# Patient Record
Sex: Female | Born: 1995 | Race: White | Hispanic: No | Marital: Single | Smoking: Current every day smoker
Health system: Southern US, Community
[De-identification: ages and names within clinical notes are randomized; demographics above are authoritative.]

## PROBLEM LIST (undated history)

## (undated) DIAGNOSIS — K279 Peptic ulcer, site unspecified, unspecified as acute or chronic, without hemorrhage or perforation: Secondary | ICD-10-CM

## (undated) DIAGNOSIS — G43909 Migraine, unspecified, not intractable, without status migrainosus: Secondary | ICD-10-CM

## (undated) DIAGNOSIS — R1115 Cyclical vomiting syndrome unrelated to migraine: Secondary | ICD-10-CM

## (undated) HISTORY — PX: ANTERIOR CRUCIATE LIGAMENT REPAIR: SHX115

## (undated) HISTORY — PX: KNEE SURGERY: SHX244

## (undated) HISTORY — DX: Migraine, unspecified, not intractable, without status migrainosus: G43.909

## (undated) HISTORY — DX: Peptic ulcer, site unspecified, unspecified as acute or chronic, without hemorrhage or perforation: K27.9

---
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
## 2015-05-23 ENCOUNTER — Emergency Department: Payer: BC Managed Care – PPO

## 2015-05-23 ENCOUNTER — Encounter: Payer: Self-pay | Admitting: *Deleted

## 2015-05-23 ENCOUNTER — Emergency Department
Admission: EM | Admit: 2015-05-23 | Discharge: 2015-05-24 | Disposition: A | Discharge status: Home / Self Care | Payer: BC Managed Care – PPO | Attending: Student | Admitting: Student

## 2015-05-23 DIAGNOSIS — R112 Nausea with vomiting, unspecified: Secondary | ICD-10-CM

## 2015-05-23 DIAGNOSIS — Z720 Tobacco use: Secondary | ICD-10-CM | POA: Insufficient documentation

## 2015-05-23 DIAGNOSIS — Z3202 Encounter for pregnancy test, result negative: Secondary | ICD-10-CM | POA: Insufficient documentation

## 2015-05-23 DIAGNOSIS — G43909 Migraine, unspecified, not intractable, without status migrainosus: Secondary | ICD-10-CM | POA: Insufficient documentation

## 2015-05-23 DIAGNOSIS — R1011 Right upper quadrant pain: Secondary | ICD-10-CM

## 2015-05-23 DIAGNOSIS — K8050 Calculus of bile duct without cholangitis or cholecystitis without obstruction: Secondary | ICD-10-CM

## 2015-05-23 HISTORY — DX: Migraine, unspecified, not intractable, without status migrainosus: G43.909

## 2015-05-23 LAB — URINALYSIS COMPLETE WITH MICROSCOPIC (ARMC ONLY)
BILIRUBIN URINE: NEGATIVE
GLUCOSE, UA: NEGATIVE mg/dL
HGB URINE DIPSTICK: NEGATIVE
LEUKOCYTES UA: NEGATIVE
Nitrite: NEGATIVE
Protein, ur: 30 mg/dL — AB
SPECIFIC GRAVITY, URINE: 1.019 (ref 1.005–1.030)
pH: 7 (ref 5.0–8.0)

## 2015-05-23 LAB — COMPREHENSIVE METABOLIC PANEL
ALBUMIN: 4.6 g/dL (ref 3.5–5.0)
ALK PHOS: 78 U/L (ref 38–126)
ALT: 21 U/L (ref 14–54)
ALT: 17 U/L (ref 14–54)
ANION GAP: 13 (ref 5–15)
AST: 35 U/L (ref 15–41)
AST: 20 U/L (ref 15–41)
Albumin: 4.4 g/dL (ref 3.5–5.0)
Alkaline Phosphatase: 73 U/L (ref 38–126)
Anion gap: 11 (ref 5–15)
BUN: 21 mg/dL — AB (ref 6–20)
BUN: 21 mg/dL — AB (ref 6–20)
CALCIUM: 9.7 mg/dL (ref 8.9–10.3)
CALCIUM: 9.5 mg/dL (ref 8.9–10.3)
CHLORIDE: 106 mmol/L (ref 101–111)
CO2: 22 mmol/L (ref 22–32)
CO2: 24 mmol/L (ref 22–32)
Chloride: 105 mmol/L (ref 101–111)
Creatinine, Ser: 0.86 mg/dL (ref 0.44–1.00)
Creatinine, Ser: 0.82 mg/dL (ref 0.44–1.00)
GFR calc Af Amer: >60 mL/min (ref >60)
GFR calc non Af Amer: >60 mL/min (ref >60)
Glucose, Bld: 104 mg/dL — ABNORMAL HIGH (ref 65–99)
Glucose, Bld: 105 mg/dL — ABNORMAL HIGH (ref 65–99)
POTASSIUM: 5.3 mmol/L — AB (ref 3.5–5.1)
POTASSIUM: 3.5 mmol/L (ref 3.5–5.1)
Sodium: 140 mmol/L (ref 135–145)
Sodium: 141 mmol/L (ref 135–145)
TOTAL PROTEIN: 8.3 g/dL — AB (ref 6.5–8.1)
TOTAL PROTEIN: 7.8 g/dL (ref 6.5–8.1)
Total Bilirubin: 2 mg/dL — ABNORMAL HIGH (ref 0.3–1.2)
Total Bilirubin: 1.2 mg/dL (ref 0.3–1.2)

## 2015-05-23 LAB — CBC WITH DIFFERENTIAL/PLATELET
BASOS ABS: 0 10*3/uL (ref 0–0.1)
Basophils Relative: 0 %
Eosinophils Absolute: 0 10*3/uL (ref 0–0.7)
Eosinophils Relative: 0 %
HEMATOCRIT: 46 % (ref 35.0–47.0)
HEMOGLOBIN: 15.3 g/dL (ref 12.0–16.0)
LYMPHS ABS: 1.6 10*3/uL (ref 1.0–3.6)
LYMPHS PCT: 13 %
MCH: 31 pg (ref 26.0–34.0)
MCHC: 33.2 g/dL (ref 32.0–36.0)
MCV: 93.5 fL (ref 80.0–100.0)
MONO ABS: 0.8 10*3/uL (ref 0.2–0.9)
MONOS PCT: 6 %
NEUTROS ABS: 10.3 10*3/uL — AB (ref 1.4–6.5)
Neutrophils Relative %: 81 %
Platelets: 382 10*3/uL (ref 150–440)
RBC: 4.92 MIL/uL (ref 3.80–5.20)
RDW: 12.8 % (ref 11.5–14.5)
WBC: 12.7 10*3/uL — AB (ref 3.6–11.0)

## 2015-05-23 LAB — PREGNANCY, URINE: PREG TEST UR: NEGATIVE

## 2015-05-23 MED ORDER — ONDANSETRON 4 MG PO TBDP
4.0000 mg | ORAL_TABLET | Freq: Once | ORAL | Status: AC
  Administered 2015-05-23: 4 mg via ORAL

## 2015-05-23 MED ORDER — PROMETHAZINE HCL 25 MG/ML IJ SOLN
INTRAMUSCULAR | Status: AC
  Administered 2015-05-23: 25 mg via INTRAMUSCULAR
  Filled 2015-05-23: qty 1

## 2015-05-23 MED ORDER — PROMETHAZINE HCL 25 MG/ML IJ SOLN
25.0000 mg | Freq: Once | INTRAMUSCULAR | Status: AC
  Administered 2015-05-23: 25 mg via INTRAMUSCULAR

## 2015-05-23 MED ORDER — ONDANSETRON 4 MG PO TBDP
ORAL_TABLET | ORAL | Status: AC
  Administered 2015-05-23: 4 mg via ORAL
  Filled 2015-05-23: qty 1

## 2015-05-23 MED ORDER — SODIUM CHLORIDE 0.9 % IV BOLUS (SEPSIS)
1000.0000 mL | Freq: Once | INTRAVENOUS | Status: AC
  Administered 2015-05-23: 1000 mL via INTRAVENOUS

## 2015-05-23 NOTE — ED Notes (Signed)
Developed ha few days ago went to urgent care yesterday had toradol injection relieved headache, ha gone still has n/v

## 2015-05-23 NOTE — ED Provider Notes (Signed)
CSN: [id number]     Arrival date & time 05/23/15  1742 History   First MD Initiated Contact with Patient 05/23/15 1829     Chief Complaint  Patient presents with  . Emesis     (Consider location/radiation/quality/duration/timing/severity/associated sxs/prior Treatment) HPI  19 year old female presents today for evaluation of nausea and vomiting. Nausea and vomiting began 2 days ago when she developed a migraine headache. This was her normal migraine headache that did not respond to sumatriptan. She went to the urgent care facility where she was given a shot of Toradol. Her completely relieved her headache but she is continued to have nausea and vomiting. Not tried any medications for nausea or vomiting. She is unable to keep anything down. She states she has vomited nearly every hour for the last 2 days. Contents of vomit are watery with no blood or bile. Nausea and vomiting is usually worse after eating. She has a strong family history of cholecystectomy. She denies any abdominal pain fevers neck pain urinary symptoms. She has not had a bowel movement in 3 days.   Past Medical History  Diagnosis Date  . Migraine    Past Surgical History  Procedure Laterality Date  . Knee surgery     No family history on file. History  Substance Use Topics  . Smoking status: Current Some Day Smoker  . Smokeless tobacco: Not on file  . Alcohol Use: Yes   OB History    No data available     Review of Systems  Constitutional: Negative for fever, chills, activity change and fatigue.  HENT: Negative for congestion, sinus pressure and sore throat.   Eyes: Negative for visual disturbance.  Respiratory: Negative for cough, chest tightness and shortness of breath.   Cardiovascular: Negative for chest pain and leg swelling.  Gastrointestinal: Positive for nausea and vomiting. Negative for abdominal pain, diarrhea and constipation (no bowel movement in 3 days).  Genitourinary: Negative for dysuria, flank  pain and pelvic pain.  Musculoskeletal: Negative for arthralgias and gait problem.  Skin: Negative for rash.  Neurological: Negative for weakness, numbness and headaches.  Hematological: Negative for adenopathy.  Psychiatric/Behavioral: Negative for behavioral problems, confusion and agitation.      Allergies  Review of patient's allergies indicates no known allergies.  Home Medications   Prior to Admission medications   Not on File   BP 144/90 mmHg  Pulse 56  Temp(Src) 97.9 F (36.6 C) (Oral)  Resp 18  Ht  (1.727 m)  Wt 203 lb (92.08 kg)  BMI 30.87 kg/m2  SpO2 99%  LMP 05/14/2015 (Exact Date) Physical Exam  Constitutional: She is oriented to person, place, and time. She appears well-developed and well-nourished. No distress.  HENT:  Head: Normocephalic and atraumatic.  Mouth/Throat: Oropharynx is clear and moist.  Eyes: EOM are normal. Pupils are equal, round, and reactive to light. Right eye exhibits no discharge. Left eye exhibits no discharge.  Neck: Normal range of motion. Neck supple.  Cardiovascular: Normal rate, regular rhythm and intact distal pulses.   Pulmonary/Chest: Effort normal and breath sounds normal. No respiratory distress. She exhibits no tenderness.  Abdominal: Soft. She exhibits no distension and no mass. There is tenderness (minimal right upper quadrant tenderness to palpation without guarding or rebound. No distention noted. No McBurney's point tenderness.). There is no rebound and no guarding.  Musculoskeletal: Normal range of motion. She exhibits no edema.  Neurological: She is alert and oriented to person, place, and time. She has normal reflexes.  Skin: Skin is warm and dry.  Psychiatric: She has a normal mood and affect. Her behavior is normal. Thought content normal.    ED Course  Procedures (including critical care time) Labs Review Labs Reviewed  CBC WITH DIFFERENTIAL/PLATELET - Abnormal; Notable for the following:    WBC 12.7 (*)     Neutro Abs 10.3 (*)    All other components within normal limits  COMPREHENSIVE METABOLIC PANEL - Abnormal; Notable for the following:    Potassium 5.3 (*)    Glucose, Bld 104 (*)    BUN 21 (*)    Total Protein 8.3 (*)    Total Bilirubin 2.0 (*)    All other components within normal limits  URINALYSIS COMPLETEWITH MICROSCOPIC ([facility] ONLY) - Abnormal; Notable for the following:    Color, Urine YELLOW (*)    APPearance CLEAR (*)    Ketones, ur 2+ (*)    Protein, ur 30 (*)    Bacteria, UA RARE (*)    Squamous Epithelial / LPF 0-5 (*)    All other components within normal limits  COMPREHENSIVE METABOLIC PANEL - Abnormal; Notable for the following:    Glucose, Bld 105 (*)    BUN 21 (*)    All other components within normal limits  PREGNANCY, URINE    Imaging Review Dg Abd 1 View  05/23/2015   CLINICAL DATA:  Nausea/vomiting, no bowel movement since Wednesday  EXAM: ABDOMEN - 1 VIEW  COMPARISON:  None.  FINDINGS: Nonobstructive bowel gas pattern.  Normal colonic stool burden.  Visualized osseous structures are within normal limits.  IMPRESSION: Unremarkable abdominal radiograph.   Electronically Signed   By: Charline Bills M.D.   On: 05/23/2015 20:38   US Abdomen Limited Ruq  05/23/2015   CLINICAL DATA:  RIGHT upper quadrant pain for 1 day, vomiting for 4 days.  EXAM: US ABDOMEN LIMITED - RIGHT UPPER QUADRANT  COMPARISON:  None.  FINDINGS: Gallbladder:  Mild gallbladder distension, with internal echoes consistent with sludge. No discrete gallstone, no wall thickening or pericholecystic fluid. No sonographic Murphy's sign elicited.  Common bile duct:  Diameter: 2 mm  Liver:  No focal lesion identified. Within normal limits in parenchymal echogenicity. Hepatopetal portal vein.  IMPRESSION: Gallbladder distended with sludge without sonographic findings of acute cholecystitis.   Electronically Signed   By: Awilda Metro M.D.   On: 05/23/2015 23:09     EKG Interpretation None       MDM   Final diagnoses:  Right upper quadrant pain    19 year old female who presents to the ER with nausea and vomiting. On exam, patient found to have minimal right upper quadrant tenderness. Vital signs are stable. KUB was ordered and showed no obstruction. CBC showed slightly elevated white count at 12.7. CMP showed bilirubin at 1.2, upper limits of normal. Patient was given Zofran and had minimal relief of nausea. Ultrasound was ordered to examine the gallbladder. She was started on 1 L of fluids. She was then given 25 mg of Phenergan IV. After fluids and Phenergan patient was able tolerate by mouth. Ultrasound showed no slight distention with sludge, no signs of acute cholecystitis. After return from ultrasound, nausea began to return. She had one episode of vomiting. She was given 10 mg of Reglan IV which relieved her nausea. Patient was able tolerate by mouth well. Patient was reexamined and found to have no abdominal tenderness to palpation. Vital signs were stable at discharge. Patient agrees to follow-up with gastroenterology for evaluation,  possible HIDA scan. Patient will continue with clear fluids and nonfatty diet. She was given Phenergan and Reglan for antiemetic. She will return to the ER for any abdominal pain, vomiting, fevers.  Evon Slack, PA-C 05/24/15 0041  Evon Slack, PA-C 05/24/15 [id number]  Gayla Doss, MD 06/01/15 [phone]

## 2015-05-24 MED ORDER — METOCLOPRAMIDE HCL 10 MG PO TABS: 10.0000 mg | ORAL_TABLET | ORAL | Status: DC | PRN

## 2015-05-24 MED ORDER — METOCLOPRAMIDE HCL 5 MG/ML IJ SOLN
10.0000 mg | Freq: Once | INTRAMUSCULAR | Status: AC
  Administered 2015-05-24: 10 mg via INTRAVENOUS

## 2015-05-24 MED ORDER — METOCLOPRAMIDE HCL 5 MG/ML IJ SOLN
INTRAMUSCULAR | Status: AC
  Filled 2015-05-24: qty 2

## 2015-05-24 MED ORDER — PROMETHAZINE HCL 12.5 MG PO TABS: 12.5000 mg | ORAL_TABLET | ORAL | Status: DC | PRN

## 2015-05-24 MED ORDER — PROMETHAZINE HCL 25 MG/ML IJ SOLN
INTRAMUSCULAR | Status: AC
  Filled 2015-05-24: qty 1

## 2015-05-24 MED ORDER — PROMETHAZINE HCL 25 MG/ML IJ SOLN
25.0000 mg | Freq: Once | INTRAMUSCULAR | Status: AC
  Administered 2015-05-24: 25 mg via INTRAVENOUS

## 2015-05-24 NOTE — Discharge Instructions (Signed)
Biliary Colic  °Biliary colic is a steady or irregular pain in the upper abdomen. It is usually under the right side of the rib cage. It happens when gallstones interfere with the normal flow of bile from the gallbladder. Bile is a liquid that helps to digest fats. Bile is made in the liver and stored in the gallbladder. When you eat a meal, bile passes from the gallbladder through the cystic duct and the common bile duct into the small intestine. There, it mixes with partially digested food. If a gallstone blocks either of these ducts, the normal flow of bile is blocked. The muscle cells in the bile duct contract forcefully to try to move the stone. This causes the pain of biliary colic.  °SYMPTOMS  °· A person with biliary colic usually complains of pain in the upper abdomen. This pain can be: °¨ In the center of the upper abdomen just below the breastbone. °¨ In the upper-right part of the abdomen, near the gallbladder and liver. °¨ Spread back toward the right shoulder blade. °· Nausea and vomiting. °· The pain usually occurs after eating. °· Biliary colic is usually triggered by the digestive system's demand for bile. The demand for bile is high after fatty meals. Symptoms can also occur when a person who has been fasting suddenly eats a very large meal. Most episodes of biliary colic pass after 1 to 5 hours. After the most intense pain passes, your abdomen may continue to ache mildly for about 24 hours. °DIAGNOSIS  °After you describe your symptoms, your caregiver will perform a physical exam. He or she will pay attention to the upper right portion of your belly (abdomen). This is the area of your liver and gallbladder. An ultrasound will help your caregiver look for gallstones. Specialized scans of the gallbladder may also be done. Blood tests may be done, especially if you have fever or if your pain persists. °PREVENTION  °Biliary colic can be prevented by controlling the risk factors for gallstones. Some of  these risk factors, such as heredity, increasing age, and pregnancy are a normal part of life. Obesity and a high-fat diet are risk factors you can change through a healthy lifestyle. Women going through menopause who take hormone replacement therapy (estrogen) are also more likely to develop biliary colic. °TREATMENT  °· Pain medication may be prescribed. °· You may be encouraged to eat a fat-free diet. °· If the first episode of biliary colic is severe, or episodes of colic keep retuning, surgery to remove the gallbladder (cholecystectomy) is usually recommended. This procedure can be done through small incisions using an instrument called a laparoscope. The procedure often requires a brief stay in the hospital. Some people can leave the hospital the same day. It is the most widely used treatment in people troubled by painful gallstones. It is effective and safe, with no complications in more than 90% of cases. °· If surgery cannot be done, medication that dissolves gallstones may be used. This medication is expensive and can take months or years to work. Only small stones will dissolve. °· Rarely, medication to dissolve gallstones is combined with a procedure called shock-wave lithotripsy. This procedure uses carefully aimed shock waves to break up gallstones. In many people treated with this procedure, gallstones form again within a few years. °PROGNOSIS  °If gallstones block your cystic duct or common bile duct, you are at risk for repeated episodes of biliary colic. There is also a 25% chance that you will develop   a gallbladder infection(acute cholecystitis), or some other complication of gallstones within 10 to 20 years. If you have surgery, schedule it at a time that is convenient for you and at a time when you are not sick. °HOME CARE INSTRUCTIONS  °· Drink plenty of clear fluids. °· Avoid fatty, greasy or fried foods, or any foods that make your pain worse. °· Take medications as directed. °SEEK MEDICAL  CARE IF:  °· You develop a fever over 100.5° F (38.1° C). °· Your pain gets worse over time. °· You develop nausea that prevents you from eating and drinking. °· You develop vomiting. °SEEK IMMEDIATE MEDICAL CARE IF:  °· You have continuous or severe belly (abdominal) pain which is not relieved with medications. °· You develop nausea and vomiting which is not relieved with medications. °· You have symptoms of biliary colic and you suddenly develop a fever and shaking chills. This may signal cholecystitis. Call your caregiver immediately. °· You develop a yellow color to your skin or the white part of your eyes (jaundice). °Document Released: 04/18/2006 Document Revised: 02/07/2012 Document Reviewed: 06/27/2008 °Exit[employer]® Patient Information ©2015 [employer]. This information is not intended to replace advice given to you by your health care provider. Make sure you discuss any questions you have with your health care provider. ° °

## 2015-05-28 ENCOUNTER — Emergency Department
Admission: EM | Admit: 2015-05-28 | Discharge: 2015-05-28 | Disposition: A | Discharge status: Home / Self Care | Payer: BC Managed Care – PPO | Attending: Emergency Medicine | Admitting: Emergency Medicine

## 2015-05-28 ENCOUNTER — Emergency Department: Payer: BC Managed Care – PPO

## 2015-05-28 DIAGNOSIS — Z3202 Encounter for pregnancy test, result negative: Secondary | ICD-10-CM | POA: Insufficient documentation

## 2015-05-28 DIAGNOSIS — Z720 Tobacco use: Secondary | ICD-10-CM | POA: Diagnosis not present

## 2015-05-28 DIAGNOSIS — K529 Noninfective gastroenteritis and colitis, unspecified: Secondary | ICD-10-CM | POA: Diagnosis not present

## 2015-05-28 DIAGNOSIS — R197 Diarrhea, unspecified: Secondary | ICD-10-CM | POA: Diagnosis present

## 2015-05-28 LAB — URINALYSIS COMPLETE WITH MICROSCOPIC (ARMC ONLY)
BACTERIA UA: NONE SEEN
BILIRUBIN URINE: NEGATIVE
GLUCOSE, UA: NEGATIVE mg/dL
HGB URINE DIPSTICK: NEGATIVE
Leukocytes, UA: NEGATIVE
Nitrite: NEGATIVE
Protein, ur: NEGATIVE mg/dL
SPECIFIC GRAVITY, URINE: 1.013 (ref 1.005–1.030)
pH: 7 (ref 5.0–8.0)

## 2015-05-28 LAB — CBC WITH DIFFERENTIAL/PLATELET
Basophils Absolute: 0.1 10*3/uL (ref 0–0.1)
Basophils Relative: 0 %
EOS ABS: 0.1 10*3/uL (ref 0–0.7)
EOS PCT: 0 %
HCT: 49.9 % — ABNORMAL HIGH (ref 35.0–47.0)
Hemoglobin: 16.6 g/dL — ABNORMAL HIGH (ref 12.0–16.0)
LYMPHS ABS: 2.1 10*3/uL (ref 1.0–3.6)
Lymphocytes Relative: 12 %
MCH: 31 pg (ref 26.0–34.0)
MCHC: 33.3 g/dL (ref 32.0–36.0)
MCV: 93.2 fL (ref 80.0–100.0)
MONO ABS: 0.8 10*3/uL (ref 0.2–0.9)
MONOS PCT: 5 %
Neutro Abs: 15.1 10*3/uL — ABNORMAL HIGH (ref 1.4–6.5)
Neutrophils Relative %: 83 %
PLATELETS: 409 10*3/uL (ref 150–440)
RBC: 5.35 MIL/uL — ABNORMAL HIGH (ref 3.80–5.20)
RDW: 12.3 % (ref 11.5–14.5)
WBC: 18.2 10*3/uL — AB (ref 3.6–11.0)

## 2015-05-28 LAB — COMPREHENSIVE METABOLIC PANEL
ALT: 63 U/L — AB (ref 14–54)
AST: 27 U/L (ref 15–41)
Albumin: 4.9 g/dL (ref 3.5–5.0)
Alkaline Phosphatase: 85 U/L (ref 38–126)
Anion gap: 15 (ref 5–15)
BUN: 10 mg/dL (ref 6–20)
CALCIUM: 9.6 mg/dL (ref 8.9–10.3)
CO2: 24 mmol/L (ref 22–32)
CREATININE: 0.94 mg/dL (ref 0.44–1.00)
Chloride: 97 mmol/L — ABNORMAL LOW (ref 101–111)
GFR calc Af Amer: >60 mL/min (ref >60)
GFR calc non Af Amer: >60 mL/min (ref >60)
Glucose, Bld: 115 mg/dL — ABNORMAL HIGH (ref 65–99)
Potassium: 3.3 mmol/L — ABNORMAL LOW (ref 3.5–5.1)
SODIUM: 136 mmol/L (ref 135–145)
TOTAL PROTEIN: 8.8 g/dL — AB (ref 6.5–8.1)
Total Bilirubin: 2.7 mg/dL — ABNORMAL HIGH (ref 0.3–1.2)

## 2015-05-28 LAB — PREGNANCY, URINE: Preg Test, Ur: NEGATIVE

## 2015-05-28 MED ORDER — METRONIDAZOLE 500 MG PO TABS
500.0000 mg | ORAL_TABLET | Freq: Once | ORAL | Status: AC
  Administered 2015-05-28: 500 mg via ORAL

## 2015-05-28 MED ORDER — CIPROFLOXACIN HCL 500 MG PO TABS
ORAL_TABLET | ORAL | Status: AC
  Administered 2015-05-28: 500 mg via ORAL
  Filled 2015-05-28: qty 1

## 2015-05-28 MED ORDER — IOHEXOL 300 MG/ML  SOLN
100.0000 mL | Freq: Once | INTRAMUSCULAR | Status: AC | PRN
  Administered 2015-05-28: 100 mL via INTRAVENOUS

## 2015-05-28 MED ORDER — ONDANSETRON HCL 4 MG/2ML IJ SOLN
4.0000 mg | Freq: Once | INTRAMUSCULAR | Status: AC
  Administered 2015-05-28: 4 mg via INTRAVENOUS

## 2015-05-28 MED ORDER — CIPROFLOXACIN HCL 500 MG PO TABS: 500.0000 mg | ORAL_TABLET | Freq: Two times a day (BID) | ORAL | Status: AC

## 2015-05-28 MED ORDER — PROMETHAZINE HCL 25 MG/ML IJ SOLN
INTRAMUSCULAR | Status: AC
  Administered 2015-05-28: 25 mg via INTRAVENOUS
  Filled 2015-05-28: qty 1

## 2015-05-28 MED ORDER — CIPROFLOXACIN HCL 500 MG PO TABS
500.0000 mg | ORAL_TABLET | ORAL | Status: AC
  Administered 2015-05-28: 500 mg via ORAL

## 2015-05-28 MED ORDER — METRONIDAZOLE 500 MG PO TABS
ORAL_TABLET | ORAL | Status: AC
  Administered 2015-05-28: 500 mg via ORAL
  Filled 2015-05-28: qty 1

## 2015-05-28 MED ORDER — SODIUM CHLORIDE 0.9 % IV SOLN
1000.0000 mL | Freq: Once | INTRAVENOUS | Status: AC
  Administered 2015-05-28: 1000 mL via INTRAVENOUS

## 2015-05-28 MED ORDER — METRONIDAZOLE 500 MG PO TABS
500.0000 mg | ORAL_TABLET | Freq: Three times a day (TID) | ORAL | Status: AC
Start: 2015-05-28 — End: 2015-06-11

## 2015-05-28 MED ORDER — ONDANSETRON HCL 4 MG PO TABS: ORAL_TABLET | ORAL | Status: DC

## 2015-05-28 MED ORDER — IOHEXOL 240 MG/ML SOLN
25.0000 mL | Freq: Once | INTRAMUSCULAR | Status: AC | PRN
  Administered 2015-05-28: 25 mL via ORAL

## 2015-05-28 MED ORDER — PROMETHAZINE HCL 25 MG/ML IJ SOLN
12.5000 mg | Freq: Once | INTRAMUSCULAR | Status: AC
  Administered 2015-05-28: 25 mg via INTRAVENOUS

## 2015-05-28 NOTE — ED Notes (Signed)
AAox3.  Skin warm and dry.  No acute distress.  Ambulates with easy and steady gait.  NAD.

## 2015-05-28 NOTE — ED Provider Notes (Addendum)
[facility] Emergency Department Provider Note  ____________________________________________  Time seen: Approximately 7:26 PM  I have reviewed the triage vital signs and the nursing notes.   HISTORY  Chief Complaint Emesis and Diarrhea    HPI Renee Carter is a 19 y.o. female Patient has been having nausea and vomiting for over a week she has had Phenergan to take and this has not worked. Patient began having diarrhea today. Patient has not had a fever. Patient is having some epigastric pain that is not severe. Patient has lost 15 pounds in the last week. Patient is not bale to keep much down at all per herself and her parents. Patient's family went to [facility] approximately week ago father got sick with nausea and vomiting but has recovered. A SHEENT has remained ill. [place] is now closed I'm told because of the [place] Department detecting unsafe level of bacteria in the water.   Past Medical History  Diagnosis Date  . Migraine     Patient Active Problem List   Diagnosis Date Noted  . Migraine     Past Surgical History  Procedure Laterality Date  . Knee surgery      Current Outpatient Rx  Name  Route  Sig  Dispense  Refill  . metoCLOPramide (REGLAN) 10 MG tablet   Oral   Take 1 tablet (10 mg total) by mouth every 4 (four) hours as needed for nausea.   30 tablet   1   . promethazine (PHENERGAN) 12.5 MG tablet   Oral   Take 1-2 tablets (12.5-25 mg total) by mouth every 4 (four) hours as needed for nausea or vomiting.   30 tablet   1     Allergies Review of patient's allergies indicates no known allergies.  No family history on file.  Social History History  Substance Use Topics  . Smoking status: Current Some Day Smoker  . Smokeless tobacco: Not on file  . Alcohol Use: Yes    Review of Systems Constitutional: No fever/chills Eyes: No visual changes. ENT: No sore throat. Cardiovascular: Denies chest  pain. Respiratory: Denies shortness of breath. Gastrointestina.  No constipation. Genitourinary: Negative for dysuria. Musculoskeletal: Negative for back pain. Skin: Negative for rash. Neurological: Negative for headaches, focal weakness or numbness.  10-point ROS otherwise negative.  ____________________________________________   PHYSICAL EXAM:  VITAL SIGNS: ED Triage Vitals  Enc Vitals Group     BP 05/28/15 1651 146/116 mmHg     Pulse Rate 05/28/15 1651 103     Resp 05/28/15 1651 18     Temp 05/28/15 1651 98.1 F (36.7 C)     Temp Source 05/28/15 1651 Oral     SpO2 05/28/15 1651 99 %     Weight 05/28/15 1651 199 lb (90.266 kg)     Height 05/28/15 1651  (1.727 m)     Head Cir --      Peak Flow --      Pain Score --      Pain Loc --      Pain Edu? --      Excl. in GC? --     Constitutional: Alert and oriented. Well appearing and in no acute distress. Eyes: Conjunctivae are normal. PERRL. EOMI. Head: Atraumatic. Nose: No congestion/rhinnorhea. Mouth/Throat: Mucous membranes are moist.  Oropharynx non-erythematous. Neck: No stridor.  Cardiovascular: Normal rate, regular rhythm. Grossly normal heart sounds.  Good peripheral circulation. Respiratory: Normal respiratory effort.  No retractions. Lungs CTAB.  Gastrointestinal: Soft and nontender except for some mild tenderness in the epigastric area there is no guarding or rebound. No distention. No abdominal bruits. No CVA tenderness. Musculoskeletal: No lower extremity tenderness nor edema.  No joint effusions. Neurologic:  Normal speech and language. No gross focal neurologic deficits are appreciated. Speech is normal. No gait instability. Skin:  Skin is warm, dry and intact. No rash noted. Psychiatric: Mood and affect are normal. Speech and behavior are normal.  ____________________________________________   LABS (all labs ordered are listed, but only abnormal results are displayed)  Labs Reviewed  CBC WITH  DIFFERENTIAL/PLATELET - Abnormal; Notable for the following:    WBC 18.2 (*)    RBC 5.35 (*)    Hemoglobin 16.6 (*)    HCT 49.9 (*)    Neutro Abs 15.1 (*)    All other components within normal limits  COMPREHENSIVE METABOLIC PANEL - Abnormal; Notable for the following:    Potassium 3.3 (*)    Chloride 97 (*)    Glucose, Bld 115 (*)    Total Protein 8.8 (*)    ALT 63 (*)    Total Bilirubin 2.7 (*)    All other components within normal limits  URINALYSIS COMPLETEWITH MICROSCOPIC ([facility] ONLY) - Abnormal; Notable for the following:    Color, Urine YELLOW (*)    APPearance CLEAR (*)    Ketones, ur 2+ (*)    Squamous Epithelial / LPF 6-30 (*)    All other components within normal limits  PREGNANCY, URINE  POC URINE PREG, ED   ____________________________________________  EKG  ____________________________________________  RADIOLOGY   ____________________________________________   PROCEDURES  Procedure(s) performed:   Critical Care performed:   ____________________________________________   INITIAL IMPRESSION / ASSESSMENT AND PLAN / ED COURSE  Pertinent labs & imaging results that were available during my care of the patient were reviewed by me and considered in my medical decision making (see chart for details).   ____________________________________________   FINAL CLINICAL IMPRESSION(S) / ED DIAGNOSES  Final diagnoses:  Intractable vomiting with nausea, vomiting of unspecified type      Arnaldo NatalPaul F Malinda, MD 05/28/15 2152  Patient discharged into the care of Dr. Chrissie NoaWilliam is not Dr. [facility]orentina [facility]  Arnaldo NatalPaul F Malinda, MD 05/28/15 2155

## 2015-05-28 NOTE — ED Notes (Signed)
Drank CT PO contrast.  Tolerated well.  No Nausea/ vomitting

## 2015-05-28 NOTE — ED Provider Notes (Signed)
-----------------------------------------   10:55 PM on 05/28/2015 -----------------------------------------  I assumed care from Dr. Darnelle CatalanMalinda.  We were awaiting CT scan results.  The results were unremarkable.  I updated the patient and her parents.  She feels a little bit better and states that she wants to go home.  I will discharge her on Cipro and Flagyl as per Dr. Farrel GobbleMalinda's recommendations.  I gave them my usual and customary return precautions.    Ct Abdomen Pelvis W Contrast  05/28/2015   CLINICAL DATA:  Vomiting, diarrhea starting Saturday, diagnosed with viral gastritis  EXAM: CT ABDOMEN AND PELVIS WITH CONTRAST  TECHNIQUE: Multidetector CT imaging of the abdomen and pelvis was performed using the standard protocol following bolus administration of intravenous contrast.  CONTRAST:  100mL OMNIPAQUE IOHEXOL 300 MG/ML  SOLN  COMPARISON:  None.  FINDINGS: Sagittal images of the spine are unremarkable. Lung bases are unremarkable.  Enhanced liver, pancreas, spleen and adrenal glands are unremarkable. No calcified gallstones are noted within gallbladder.  No thickening of gastric wall.  No gastric outlet obstruction.  No aortic aneurysm. No thickened or dilated small bowel loops. No small bowel obstruction. No ascites or free air. No adenopathy.  Kidneys are symmetrical in size and enhancement. No hydronephrosis or hydroureter.  No pericecal inflammation. Normal appendix is noted in axial image 70. The terminal ileum is unremarkable. The uterus and ovaries are unremarkable. No pelvic ascites or adenopathy. No inguinal adenopathy. The urinary bladder is unremarkable.  IMPRESSION: 1. No acute inflammatory process within abdomen or pelvis. 2. No hydronephrosis or hydroureter. 3. Normal appendix.  No pericecal inflammation. 4. No small bowel obstruction pre 5. No thickening of gastric wall.  No gastric outlet obstruction.   Electronically Signed   By: Natasha MeadLiviu  Pop M.D.   On: 05/28/2015 22:09        Loleta Roseory  Vito Beg, MD 05/28/15 2257

## 2015-05-28 NOTE — ED Notes (Signed)
Pt was seen here Saturday morning for n/v, diagnosed with viral gastritis, reports pt has not improved. Pt unable to keep PO. Phenergan that was prescribed not helping. Pt actively vomiting in triage.

## 2015-05-28 NOTE — Discharge Instructions (Signed)
We believe your symptoms are caused by either a viral infection or possible exposure to contaminated water.  Either way, since your symptoms have improved and your workup is reassuring, we feel it is safe for you to go home and follow up with your regular doctor.  Please read the included information and stick to a bland diet for the next two days.  Drink plenty of clear fluids, and if you were provided with a prescription, please take it according to the label instructions.    If you develop any new or worsening symptoms, including persistent vomiting not controlled with medication, fever greater than 101, severe or worsening abdominal pain, or other symptoms that concern you, please return immediately to the Emergency Department.    Viral Gastroenteritis Viral gastroenteritis is also known as stomach flu. This condition affects the stomach and intestinal tract. It can cause sudden diarrhea and vomiting. The illness typically lasts 3 to 8 days. Most people develop an immune response that eventually gets rid of the virus. While this natural response develops, the virus can make you quite ill. CAUSES  Many different viruses can cause gastroenteritis, such as rotavirus or noroviruses. You can catch one of these viruses by consuming contaminated food or water. You may also catch a virus by sharing utensils or other personal items with an infected person or by touching a contaminated surface. SYMPTOMS  The most common symptoms are diarrhea and vomiting. These problems can cause a severe loss of body fluids (dehydration) and a body salt (electrolyte) imbalance. Other symptoms may include:  Fever.  Headache.  Fatigue.  Abdominal pain. DIAGNOSIS  Your caregiver can usually diagnose viral gastroenteritis based on your symptoms and a physical exam. A stool sample may also be taken to test for the presence of viruses or other infections. TREATMENT  This illness typically goes away on its own. Treatments  are aimed at rehydration. The most serious cases of viral gastroenteritis involve vomiting so severely that you are not able to keep fluids down. In these cases, fluids must be given through an intravenous line (IV). HOME CARE INSTRUCTIONS   Drink enough fluids to keep your urine clear or pale yellow. Drink small amounts of fluids frequently and increase the amounts as tolerated.  Ask your caregiver for specific rehydration instructions.  Avoid:  Foods high in sugar.  Alcohol.  Carbonated drinks.  Tobacco.  Juice.  Caffeine drinks.  Extremely hot or cold fluids.  Fatty, greasy foods.  Too much intake of anything at one time.  Dairy products until 24 to 48 hours after diarrhea stops.  You may consume probiotics. Probiotics are active cultures of beneficial bacteria. They may lessen the amount and number of diarrheal stools in adults. Probiotics can be found in yogurt with active cultures and in supplements.  Wash your hands well to avoid spreading the virus.  Only take over-the-counter or prescription medicines for pain, discomfort, or fever as directed by your caregiver. Do not give aspirin to children. Antidiarrheal medicines are not recommended.  Ask your caregiver if you should continue to take your regular prescribed and over-the-counter medicines.  Keep all follow-up appointments as directed by your caregiver. SEEK IMMEDIATE MEDICAL CARE IF:   You are unable to keep fluids down.  You do not urinate at least once every 6 to 8 hours.  You develop shortness of breath.  You notice blood in your stool or vomit. This may look like coffee grounds.  You have abdominal pain that increases or is  concentrated in one small area (localized).  You have persistent vomiting or diarrhea.  You have a fever.  The patient is a child younger than 3 months, and he or she has a fever.  The patient is a child older than 3 months, and he or she has a fever and persistent  symptoms.  The patient is a child older than 3 months, and he or she has a fever and symptoms suddenly get worse.  The patient is a baby, and he or she has no tears when crying. MAKE SURE YOU:   Understand these instructions.  Will watch your condition.  Will get help right away if you are not doing well or get worse. Document Released: 11/15/2005 Document Revised: 02/07/2012 Document Reviewed: 09/01/2011 [facility] Patient Information 2015 [place], [place]. This information is not intended to replace advice given to you by your health care provider. Make sure you discuss any questions you have with your health care provider.

## 2015-06-12 ENCOUNTER — Encounter
Admission: RE | Disposition: A | Discharge status: Home / Self Care | Payer: Self-pay | Source: Ambulatory Visit | Attending: Unknown Physician Specialty

## 2015-06-12 ENCOUNTER — Ambulatory Visit: Payer: BC Managed Care – PPO | Admitting: Anesthesiology

## 2015-06-12 ENCOUNTER — Ambulatory Visit
Admission: RE | Admit: 2015-06-12 | Discharge: 2015-06-12 | Disposition: A | Discharge status: Home / Self Care | Payer: BC Managed Care – PPO | Source: Ambulatory Visit | Attending: Unknown Physician Specialty | Admitting: Unknown Physician Specialty

## 2015-06-12 DIAGNOSIS — R1013 Epigastric pain: Secondary | ICD-10-CM | POA: Diagnosis present

## 2015-06-12 DIAGNOSIS — F17210 Nicotine dependence, cigarettes, uncomplicated: Secondary | ICD-10-CM | POA: Insufficient documentation

## 2015-06-12 DIAGNOSIS — R112 Nausea with vomiting, unspecified: Secondary | ICD-10-CM | POA: Diagnosis present

## 2015-06-12 DIAGNOSIS — K2900 Acute gastritis without bleeding: Secondary | ICD-10-CM | POA: Insufficient documentation

## 2015-06-12 DIAGNOSIS — K319 Disease of stomach and duodenum, unspecified: Secondary | ICD-10-CM | POA: Insufficient documentation

## 2015-06-12 DIAGNOSIS — Z79899 Other long term (current) drug therapy: Secondary | ICD-10-CM | POA: Insufficient documentation

## 2015-06-12 DIAGNOSIS — K210 Gastro-esophageal reflux disease with esophagitis: Secondary | ICD-10-CM | POA: Insufficient documentation

## 2015-06-12 HISTORY — PX: ESOPHAGOGASTRODUODENOSCOPY: SHX5428

## 2015-06-12 LAB — POCT PREGNANCY, URINE: Preg Test, Ur: NEGATIVE

## 2015-06-12 SURGERY — EGD (ESOPHAGOGASTRODUODENOSCOPY)
Anesthesia: General

## 2015-06-12 MED ORDER — FENTANYL CITRATE (PF) 100 MCG/2ML IJ SOLN
INTRAMUSCULAR | Status: DC | PRN
  Administered 2015-06-12: 50 ug via INTRAVENOUS

## 2015-06-12 MED ORDER — LIDOCAINE HCL (PF) 2 % IJ SOLN
INTRAMUSCULAR | Status: DC | PRN
  Administered 2015-06-12: 50 mg

## 2015-06-12 MED ORDER — GLYCOPYRROLATE 0.2 MG/ML IJ SOLN
INTRAMUSCULAR | Status: DC | PRN
  Administered 2015-06-12: 0.2 mg via INTRAVENOUS

## 2015-06-12 MED ORDER — SODIUM CHLORIDE 0.9 % IV SOLN
INTRAVENOUS | Status: DC
  Administered 2015-06-12: 1000 mL via INTRAVENOUS

## 2015-06-12 MED ORDER — PROPOFOL INFUSION 10 MG/ML OPTIME
INTRAVENOUS | Status: DC | PRN
  Administered 2015-06-12: 160 ug/kg/min via INTRAVENOUS

## 2015-06-12 MED ORDER — PROPOFOL 10 MG/ML IV BOLUS
INTRAVENOUS | Status: DC | PRN
  Administered 2015-06-12: 50 mg via INTRAVENOUS

## 2015-06-12 MED ORDER — MIDAZOLAM HCL 5 MG/5ML IJ SOLN
INTRAMUSCULAR | Status: DC | PRN
  Administered 2015-06-12: 1 mg via INTRAVENOUS

## 2015-06-12 NOTE — H&P (Signed)
Primary Care Physician:  No PCP Per Patient Primary Gastroenterologist:  Dr. Mechele CollinElliott  Pre-Procedure History & Physical: HPI:  Renee HeinrichMacy A Otterness is a 19 y.o. female is here for an endoscopy.   Past Medical History  Diagnosis Date  . Migraine     Past Surgical History  Procedure Laterality Date  . Knee surgery      Prior to Admission medications   Medication Sig Start Date End Date Taking? Authorizing Provider  metoCLOPramide (REGLAN) 10 MG tablet Take 1 tablet (10 mg total) by mouth every 4 (four) hours as needed for nausea. 05/24/15 05/23/16  Evon Slackhomas C Gaines, PA-C  ondansetron [facility](ZOFRAN) 4 MG tablet Take 1-2 tabs by mouth every 8 hours as needed for nausea/vomiting 05/28/15   Loleta Roseory Forbach, MD  promethazine (PHENERGAN) 12.5 MG tablet Take 1-2 tablets (12.5-25 mg total) by mouth every 4 (four) hours as needed for nausea or vomiting. 05/24/15   Evon Slackhomas C Gaines, PA-C    Allergies as of 06/11/2015  . (No Known Allergies)    No family history on file.  History   Social History  . Marital Status: Single    Spouse Name: N/A  . Number of Children: N/A  . Years of Education: N/A   Occupational History  . Not on file.   Social History Main Topics  . Smoking status: Current Some Day Smoker  . Smokeless tobacco: Not on file  . Alcohol Use: Yes  . Drug Use: Not on file  . Sexual Activity: Not on file   Other Topics Concern  . Not on file   Social History Narrative    Review of Systems: See HPI, otherwise negative ROS  Physical Exam: LMP 05/14/2015 (Exact Date) General:   Alert,  pleasant and cooperative in NAD Head:  Normocephalic and atraumatic. Neck:  Supple; no masses or thyromegaly. Lungs:  Clear throughout to auscultation.    Heart:  Regular rate and rhythm. Abdomen:  Soft, nontender and nondistended. Normal bowel sounds, without guarding, and without rebound.   Neurologic:  Alert and  oriented x4;  grossly normal neurologically.  Impression/Plan: Renee HeinrichMacy A Maione  is here for an endoscopy to be performed for epigastric abd pain and vomiting  Risks, benefits, limitations, and alternatives regarding  endoscopy have been reviewed with the patient.  Questions have been answered.  All parties agreeable.   Lynnae PrudeELLIOTT, ROBERT, MD  06/12/2015, 7:24 AM   Primary Care Physician:  No PCP Per Patient Primary Gastroenterologist:  Dr. Mechele CollinElliott  Pre-Procedure History & Physical: HPI:  Renee Carter is a 19 y.o. female is here for an endoscopy.   Past Medical History  Diagnosis Date  . Migraine     Past Surgical History  Procedure Laterality Date  . Knee surgery      Prior to Admission medications   Medication Sig Start Date End Date Taking? Authorizing Provider  metoCLOPramide (REGLAN) 10 MG tablet Take 1 tablet (10 mg total) by mouth every 4 (four) hours as needed for nausea. 05/24/15 05/23/16  Evon Slackhomas C Gaines, PA-C  ondansetron [facility](ZOFRAN) 4 MG tablet Take 1-2 tabs by mouth every 8 hours as needed for nausea/vomiting 05/28/15   Loleta Roseory Forbach, MD  promethazine (PHENERGAN) 12.5 MG tablet Take 1-2 tablets (12.5-25 mg total) by mouth every 4 (four) hours as needed for nausea or vomiting. 05/24/15   Evon Slackhomas C Gaines, PA-C    Allergies as of 06/11/2015  . (No Known Allergies)    No family history on file.  History  Social History  . Marital Status: Single    Spouse Name: N/A  . Number of Children: N/A  . Years of Education: N/A   Occupational History  . Not on file.   Social History Main Topics  . Smoking status: Current Some Day Smoker  . Smokeless tobacco: Not on file  . Alcohol Use: Yes  . Drug Use: Not on file  . Sexual Activity: Not on file   Other Topics Concern  . Not on file   Social History Narrative    Review of Systems: See HPI, otherwise negative ROS  Physical Exam: LMP 05/14/2015 (Exact Date) General:   Alert,  pleasant and cooperative in NAD Head:  Normocephalic and atraumatic. Neck:  Supple; no masses or  thyromegaly. Lungs:  Clear throughout to auscultation.    Heart:  Regular rate and rhythm. Abdomen:  Soft, nontender and nondistended. Normal bowel sounds, without guarding, and without rebound.   Neurologic:  Alert and  oriented x4;  grossly normal neurologically.  Impression/Plan: Renee Carter is here for an endoscopy to be performed for epigastric abdominal pain and vomiting  Risks, benefits, limitations, and alternatives regarding  endoscopy have been reviewed with the patient.  Questions have been answered.  All parties agreeable.   Lynnae Prude, MD  06/12/2015, 7:24 AM

## 2015-06-12 NOTE — Anesthesia Postprocedure Evaluation (Signed)
  Anesthesia Post-op Note  Patient: Renee Carter  Procedure(s) Performed: Procedure(s): ESOPHAGOGASTRODUODENOSCOPY (EGD) (N/A)  Anesthesia type:General  Patient location: PACU  Post pain: Pain level controlled  Post assessment: Post-op Vital signs reviewed, Patient's Cardiovascular Status Stable, Respiratory Function Stable, Patent Airway and No signs of Nausea or vomiting  Post vital signs: Reviewed and stable  Last Vitals:  Filed Vitals:   06/12/15 0819  BP: 101/57  Pulse: 65  Temp: 36 C  Resp: 18    Level of consciousness: awake, alert  and patient cooperative  Complications: No apparent anesthesia complications

## 2015-06-12 NOTE — Anesthesia Preprocedure Evaluation (Addendum)
Anesthesia Evaluation  Patient identified by MRN, date of birth, ID band Patient awake    Reviewed: Allergy & Precautions, NPO status , Patient's Chart, lab work & pertinent test results  History of Anesthesia Complications Negative for: history of anesthetic complications  Airway Mallampati: II  TM Distance: >3 FB Neck ROM: Full    Dental no notable dental hx.    Pulmonary neg pulmonary ROS, Current Smoker,  breath sounds clear to auscultation  Pulmonary exam normal       Cardiovascular Exercise Tolerance: Good negative cardio ROS Normal cardiovascular examRhythm:Regular Rate:Normal     Neuro/Psych  Headaches, negative psych ROS   GI/Hepatic negative GI ROS, Neg liver ROS,   Endo/Other  negative endocrine ROS  Renal/GU negative Renal ROS  negative genitourinary   Musculoskeletal negative musculoskeletal ROS (+)   Abdominal   Peds negative pediatric ROS (+)  Hematology negative hematology ROS (+)   Anesthesia Other Findings   Reproductive/Obstetrics negative OB ROS                            Anesthesia Physical Anesthesia Plan  ASA: II  Anesthesia Plan: General   Post-op Pain Management:    Induction: Intravenous  Airway Management Planned: Nasal Cannula  Additional Equipment:   Intra-op Plan:   Post-operative Plan:   Informed Consent: I have reviewed the patients History and Physical, chart, labs and discussed the procedure including the risks, benefits and alternatives for the proposed anesthesia with the patient or authorized representative who has indicated his/her understanding and acceptance.     Plan Discussed with: CRNA and Surgeon  Anesthesia Plan Comments:         Anesthesia Quick Evaluation

## 2015-06-12 NOTE — Op Note (Signed)
[facility] Gastroenterology Patient Name: Renee Carter Procedure Date: 06/12/2015 7:32 AM MRN: [id number] Account #: [id number] Date of Birth: [birth year] Admit Type: Outpatient Age: 19 Room: [facility] ENDO ROOM 1 Gender: Female Note Status: Finalized Procedure:         Upper GI endoscopy Indications:       Epigastric abdominal pain, Nausea with vomiting Providers:         Scot Jun, MD Referring MD:      No Local Md, MD (Referring MD) Medicines:         Propofol per Anesthesia Complications:     No immediate complications. Procedure:         Pre-Anesthesia Assessment:                    - After reviewing the risks and benefits, the patient was                     deemed in satisfactory condition to undergo the procedure.                    After obtaining informed consent, the endoscope was passed                     under direct vision. Throughout the procedure, the                     patient's blood pressure, pulse, and oxygen saturations                     were monitored continuously. The Olympus GIF-160 endoscope                     (S#. [id number]) was introduced through the mouth, and                     advanced to the second part of duodenum. The upper GI                     endoscopy was accomplished without difficulty. The patient                     tolerated the procedure well. Findings:      LA Grade B (one or more mucosal breaks greater than 5 mm, not extending       between the tops of two mucosal folds) esophagitis with no bleeding was       found 35 cm from the incisors. GEJ 40cm.      Localized severe inflammation characterized by erosions, erythema and       granularity was found on the lesser curvature of the very proximal       gastric body. Biopsies were taken with forceps, scattered very minor       inflammation patches seen in the stomach in body and antrum.      The examined duodenum was normal. Impression:        - LA Grade B reflux  esophagitis.                    - Acute gastritis. Biopsied.                    - Normal examined duodenum. Recommendation:    - Await pathology results. Scot Jun, MD 06/12/2015 8:20:01 AM This report has been signed electronically.  Number of Addenda: 0 Note Initiated On: 06/12/2015 7:32 AM      [facility]

## 2015-06-12 NOTE — Transfer of Care (Signed)
Immediate Anesthesia Transfer of Care Note  Patient: Renee Carter  Procedure(s) Performed: Procedure(s): ESOPHAGOGASTRODUODENOSCOPY (EGD) (N/A)  Patient Location: PACU  Anesthesia Type:General  Level of Consciousness: sedated  Airway & Oxygen Therapy: Patient Spontanous Breathing and Patient connected to nasal cannula oxygen  Post-op Assessment: Report given to RN and Post -op Vital signs reviewed and stable  Post vital signs: Reviewed and stable  Last Vitals:  Filed Vitals:   06/12/15 0731  BP: 118/74  Pulse: 81  Temp: 35.8 C  Resp: 16    Complications: No apparent anesthesia complications

## 2015-06-13 LAB — SURGICAL PATHOLOGY

## 2015-06-23 ENCOUNTER — Encounter: Payer: Self-pay | Admitting: Unknown Physician Specialty

## 2016-03-26 ENCOUNTER — Encounter: Payer: Self-pay | Admitting: Emergency Medicine

## 2016-03-26 ENCOUNTER — Emergency Department: Payer: BC Managed Care – PPO

## 2016-03-26 ENCOUNTER — Emergency Department
Admission: EM | Admit: 2016-03-26 | Discharge: 2016-03-26 | Disposition: A | Discharge status: Home / Self Care | Payer: BC Managed Care – PPO | Attending: Student | Admitting: Student

## 2016-03-26 DIAGNOSIS — Z79899 Other long term (current) drug therapy: Secondary | ICD-10-CM | POA: Diagnosis not present

## 2016-03-26 DIAGNOSIS — R1111 Vomiting without nausea: Secondary | ICD-10-CM

## 2016-03-26 DIAGNOSIS — G43809 Other migraine, not intractable, without status migrainosus: Secondary | ICD-10-CM | POA: Insufficient documentation

## 2016-03-26 DIAGNOSIS — F17200 Nicotine dependence, unspecified, uncomplicated: Secondary | ICD-10-CM | POA: Diagnosis not present

## 2016-03-26 DIAGNOSIS — R1110 Vomiting, unspecified: Secondary | ICD-10-CM | POA: Diagnosis present

## 2016-03-26 LAB — URINALYSIS COMPLETE WITH MICROSCOPIC (ARMC ONLY)
BACTERIA UA: NONE SEEN
Bilirubin Urine: NEGATIVE
Glucose, UA: NEGATIVE mg/dL
Hgb urine dipstick: NEGATIVE
Leukocytes, UA: NEGATIVE
NITRITE: NEGATIVE
PH: 9 — AB (ref 5.0–8.0)
PROTEIN: 100 mg/dL — AB
SPECIFIC GRAVITY, URINE: 1.024 (ref 1.005–1.030)

## 2016-03-26 LAB — COMPREHENSIVE METABOLIC PANEL
ALBUMIN: 4.1 g/dL (ref 3.5–5.0)
ALK PHOS: 84 U/L (ref 38–126)
ALT: 53 U/L (ref 14–54)
AST: 36 U/L (ref 15–41)
Anion gap: 10 (ref 5–15)
BUN: 11 mg/dL (ref 6–20)
CALCIUM: 8.9 mg/dL (ref 8.9–10.3)
CHLORIDE: 106 mmol/L (ref 101–111)
CO2: 21 mmol/L — AB (ref 22–32)
CREATININE: 0.58 mg/dL (ref 0.44–1.00)
GFR calc Af Amer: >60 mL/min (ref >60)
GFR calc non Af Amer: >60 mL/min (ref >60)
GLUCOSE: 120 mg/dL — AB (ref 65–99)
Potassium: 4.1 mmol/L (ref 3.5–5.1)
SODIUM: 137 mmol/L (ref 135–145)
Total Bilirubin: 1 mg/dL (ref 0.3–1.2)
Total Protein: 7.2 g/dL (ref 6.5–8.1)

## 2016-03-26 LAB — CBC WITH DIFFERENTIAL/PLATELET
BASOS ABS: 0 10*3/uL (ref 0–0.1)
BASOS PCT: 0 %
Band Neutrophils: 6 %
Blasts: 0 %
EOS ABS: 0 10*3/uL (ref 0–0.7)
EOS PCT: 0 %
HCT: 39.8 % (ref 35.0–47.0)
Hemoglobin: 13.7 g/dL (ref 12.0–16.0)
LYMPHS ABS: 4 10*3/uL — AB (ref 1.0–3.6)
LYMPHS PCT: 25 %
MCH: 31.8 pg (ref 26.0–34.0)
MCHC: 34.4 g/dL (ref 32.0–36.0)
MCV: 92.4 fL (ref 80.0–100.0)
MONO ABS: 0.3 10*3/uL (ref 0.2–0.9)
MYELOCYTES: 0 %
Metamyelocytes Relative: 1 %
Monocytes Relative: 2 %
NEUTROS ABS: 11.8 10*3/uL — AB (ref 1.4–6.5)
Neutrophils Relative %: 66 %
Other: 0 %
PLATELETS: 301 10*3/uL (ref 150–440)
Promyelocytes Absolute: 0 %
RBC: 4.3 MIL/uL (ref 3.80–5.20)
RDW: 12.3 % (ref 11.5–14.5)
WBC: 16.1 10*3/uL — ABNORMAL HIGH (ref 3.6–11.0)
nRBC: 0 /100 WBC

## 2016-03-26 LAB — URINE DRUG SCREEN, QUALITATIVE (ARMC ONLY)
AMPHETAMINES, UR SCREEN: NOT DETECTED
BARBITURATES, UR SCREEN: NOT DETECTED
BENZODIAZEPINE, UR SCRN: NOT DETECTED
CANNABINOID 50 NG, UR ~~LOC~~: POSITIVE — AB
Cocaine Metabolite,Ur ~~LOC~~: NOT DETECTED
MDMA (Ecstasy)Ur Screen: NOT DETECTED
Methadone Scn, Ur: NOT DETECTED
OPIATE, UR SCREEN: NOT DETECTED
PHENCYCLIDINE (PCP) UR S: NOT DETECTED
Tricyclic, Ur Screen: NOT DETECTED

## 2016-03-26 LAB — LIPASE, BLOOD: Lipase: 14 U/L (ref 11–51)

## 2016-03-26 LAB — POCT PREGNANCY, URINE: PREG TEST UR: NEGATIVE

## 2016-03-26 MED ORDER — SODIUM CHLORIDE 0.9 % IV BOLUS (SEPSIS)
1000.0000 mL | Freq: Once | INTRAVENOUS | Status: AC
  Administered 2016-03-26: 1000 mL via INTRAVENOUS

## 2016-03-26 MED ORDER — ONDANSETRON 4 MG PO TBDP: 4.0000 mg | ORAL_TABLET | Freq: Three times a day (TID) | ORAL | Status: AC | PRN

## 2016-03-26 MED ORDER — ONDANSETRON HCL 4 MG/2ML IJ SOLN
4.0000 mg | Freq: Once | INTRAMUSCULAR | Status: AC
  Administered 2016-03-26: 4 mg via INTRAVENOUS
  Filled 2016-03-26: qty 2

## 2016-03-26 MED ORDER — DIPHENHYDRAMINE HCL 50 MG/ML IJ SOLN
12.5000 mg | Freq: Once | INTRAMUSCULAR | Status: AC
  Administered 2016-03-26: 08:00:00 via INTRAVENOUS
  Filled 2016-03-26: qty 1

## 2016-03-26 MED ORDER — METOCLOPRAMIDE HCL 5 MG/ML IJ SOLN
10.0000 mg | Freq: Once | INTRAMUSCULAR | Status: AC
  Administered 2016-03-26: 10 mg via INTRAVENOUS
  Filled 2016-03-26: qty 2

## 2016-03-26 MED ORDER — KETOROLAC TROMETHAMINE 30 MG/ML IJ SOLN
15.0000 mg | Freq: Once | INTRAMUSCULAR | Status: AC
  Administered 2016-03-26: 15 mg via INTRAVENOUS
  Filled 2016-03-26: qty 1

## 2016-03-26 NOTE — ED Notes (Signed)
Reports migraine and vomiting.  NAD at this time

## 2016-03-26 NOTE — ED Notes (Signed)
Pt verbalized understanding of discharge instructions. NAD at this time. 

## 2016-03-26 NOTE — ED Provider Notes (Signed)
[facility] Emergency Department Provider Note  ____________________________________________  Time seen: Approximately 7:42 AM  I have reviewed the triage vital signs and the nursing notes.   HISTORY  Chief Complaint Emesis    HPI Renee Carter is a 20 y.o. female with history of migraines, history of recurrent vomiting secondary to gastritis and reflux esophagitis by endoscopy who presents for evaluation of migraine and recurrent vomiting, gradual onset yesterday evening, constant since onset, moderate, no modifying factors. Patient reports that last night she began having her typical migraine headache. This morning she awoke from sleep and had 6 episodes of nonbloody nonbilious emesis. No diarrhea, no fevers, no abdominal pain, no chills. No chest pain or difficulty breathing, numbness or weakness, no vision change but she has had some light sensitivity. She has had cough. No dysuria or hematuria. Of note, she does smoke marijuana almost daily.   Past Medical History  Diagnosis Date  . Migraine     Patient Active Problem List   Diagnosis Date Noted  . Migraine     Past Surgical History  Procedure Laterality Date  . Knee surgery    . Esophagogastroduodenoscopy N/A 06/12/2015    Procedure: ESOPHAGOGASTRODUODENOSCOPY (EGD);  Surgeon: Scot Junobert T Elliott, MD;  [facility];  Service: Endoscopy;  Laterality: N/A;    Current Outpatient Rx  Name  Route  Sig  Dispense  Refill  . omeprazole (PRILOSEC) 40 MG capsule   Oral   Take 40 mg by mouth daily.         . ondansetron (ZOFRAN ODT) 4 MG disintegrating tablet   Oral   Take 1 tablet (4 mg total) by mouth every 8 (eight) hours as needed for nausea or vomiting.   12 tablet   0     Allergies Review of patient's allergies indicates no known allergies.  History reviewed. No pertinent family history.  Social History Social History  Substance Use Topics  . Smoking status: Current Some  Day Smoker  . Smokeless tobacco: None  . Alcohol Use: Yes    Review of Systems Constitutional: No fever/chills Eyes: No visual changes. ENT: No sore throat. Cardiovascular: Denies chest pain. Respiratory: Denies shortness of breath. Gastrointestinal: No abdominal pain.  + nausea, + vomiting.  No diarrhea.  No constipation. Genitourinary: Negative for dysuria. Musculoskeletal: Negative for back pain. Skin: Negative for rash. Neurological: Positive for migraine headache, no focal weakness or numbness.  10-point ROS otherwise negative.  ____________________________________________   PHYSICAL EXAM:  VITAL SIGNS: ED Triage Vitals  Enc Vitals Group     BP 03/26/16 0736 143/90 mmHg     Pulse Rate 03/26/16 0736 76     Resp 03/26/16 0736 16     Temp 03/26/16 0736 97.8 F (36.6 C)     Temp Source 03/26/16 0736 Oral     SpO2 03/26/16 0736 98 %     Weight 03/26/16 0736 210 lb (95.255 kg)     Height 03/26/16 0736 5\' 8"  (1.727 m)     Head Cir --      Peak Flow --      Pain Score 03/26/16 0737 7     Pain Loc --      Pain Edu? --      Excl. in GC? --     Constitutional: Alert and oriented.Nontoxic-appearing but intermittently retching into an emesis bag at bedside. Eyes: Conjunctivae are normal. PERRL. EOMI. Head: Atraumatic. Nose: No congestion/rhinnorhea. Mouth/Throat: Mucous membranes are moist.  Oropharynx non-erythematous. Neck:  No stridor. Supple without meningismus. Cardiovascular: Normal rate, regular rhythm. Grossly normal heart sounds.  Good peripheral circulation. Respiratory: Normal respiratory effort.  No retractions. Faint wheeze in the right lower lung fields. Gastrointestinal: Soft and nontender. No distention.  No CVA tenderness. Genitourinary: deferred Musculoskeletal: No lower extremity tenderness nor edema.  No joint effusions. Neurologic:  Normal speech and language. No gross focal neurologic deficits are appreciated. No gait instability. 5 out of 5  strength in bilateral upper and lower extremities; sensation intact to light touch throughout, cranial nerves II through XII intact. Skin:  Skin is warm, dry and intact. No rash noted. Psychiatric: Mood and affect are normal. Speech and behavior are normal.  ____________________________________________   LABS (all labs ordered are listed, but only abnormal results are displayed)  Labs Reviewed  CBC WITH DIFFERENTIAL/PLATELET - Abnormal; Notable for the following:    WBC 16.1 (*)    Neutro Abs 11.8 (*)    Lymphs Abs 4.0 (*)    All other components within normal limits  COMPREHENSIVE METABOLIC PANEL - Abnormal; Notable for the following:    CO2 21 (*)    Glucose, Bld 120 (*)    All other components within normal limits  URINALYSIS COMPLETEWITH MICROSCOPIC ([facility] ONLY) - Abnormal; Notable for the following:    Color, Urine YELLOW (*)    APPearance CLEAR (*)    Ketones, ur 1+ (*)    pH 9.0 (*)    Protein, ur 100 (*)    Squamous Epithelial / LPF 0-5 (*)    All other components within normal limits  LIPASE, BLOOD  URINE DRUG SCREEN, QUALITATIVE ([facility] ONLY)  POC URINE PREG, ED  POCT PREGNANCY, URINE   ____________________________________________  EKG  none ____________________________________________  RADIOLOGY  CXR IMPRESSION: No active cardiopulmonary disease. ____________________________________________   PROCEDURES  Procedure(s) performed: None  Critical Care performed: No  ____________________________________________   INITIAL IMPRESSION / ASSESSMENT AND PLAN / ED COURSE  Pertinent labs & imaging results that were available during my care of the patient were reviewed by me and considered in my medical decision making (see chart for details).  Renee Carter is a 20 y.o. female with history of migraines, history of recurrent vomiting secondary to gastritis and reflux esophagitis by endoscopy who presents for evaluation of migraine and recurrent vomiting. On  exam, she is nontoxic appearing but she is retching. She has an intact neurological exam. Her neck is supple without meningismus and her headache is similar to her usual migraines so I doubt subarachnoid hemorrhage or meningitis. Her abdominal is benign. Plan for labs, treat with migraine cocktail, IV fluids, will also check a chest x-ray given her slightly asymmetric lung sounds. Reassess for disposition and need for advanced imaging.  ----------------------------------------- 10:18 AM on 03/26/2016 ----------------------------------------- Patient reports symptomatic improvement of headache and nausea vomiting at this time. She is no longer vomiting though she reports she still feels nauseated. Labs reviewed. CBC is notable for chronic leukocytosis which is improved from what it was previously, white blood cell count is 16.1. Unremarkable CMP with the exception of, lipase, negative urine pregnancy test, urinalysis not consistent with infection. There are faint ketones on her UA which I suspect is secondary to dehydration. IV fluids given. Chest x-ray clear. Discussed return precautions, need for close PCP follow-up and she and her mother are comfortable with the discharge plan. We discussed that her vomiting could be secondary to cannabis hyperemesis syndrome and I have encouraged her to stop smoking marijuana. DC home.  ____________________________________________   FINAL CLINICAL IMPRESSION(S) / ED DIAGNOSES  Final diagnoses:  Other migraine without status migrainosus, not intractable  Non-intractable vomiting without nausea, unspecified vomiting type      Gayla Doss, MD 03/26/16 1021

## 2016-03-26 NOTE — ED Notes (Signed)
Patient transported to X-ray 

## 2016-03-29 ENCOUNTER — Encounter: Payer: Self-pay | Admitting: Emergency Medicine

## 2016-03-29 ENCOUNTER — Emergency Department
Admission: EM | Admit: 2016-03-29 | Discharge: 2016-03-29 | Disposition: A | Discharge status: Home / Self Care | Payer: BC Managed Care – PPO | Attending: Emergency Medicine | Admitting: Emergency Medicine

## 2016-03-29 DIAGNOSIS — F17200 Nicotine dependence, unspecified, uncomplicated: Secondary | ICD-10-CM | POA: Diagnosis not present

## 2016-03-29 DIAGNOSIS — R112 Nausea with vomiting, unspecified: Secondary | ICD-10-CM | POA: Diagnosis present

## 2016-03-29 DIAGNOSIS — F1210 Cannabis abuse, uncomplicated: Secondary | ICD-10-CM | POA: Diagnosis not present

## 2016-03-29 DIAGNOSIS — G43D0 Abdominal migraine, not intractable: Secondary | ICD-10-CM | POA: Insufficient documentation

## 2016-03-29 LAB — COMPREHENSIVE METABOLIC PANEL
ALK PHOS: 66 U/L (ref 38–126)
ALT: 29 U/L (ref 14–54)
AST: 17 U/L (ref 15–41)
Albumin: 3.9 g/dL (ref 3.5–5.0)
Anion gap: 10 (ref 5–15)
BUN: 17 mg/dL (ref 6–20)
CALCIUM: 8.3 mg/dL — AB (ref 8.9–10.3)
CHLORIDE: 104 mmol/L (ref 101–111)
CO2: 24 mmol/L (ref 22–32)
CREATININE: 0.75 mg/dL (ref 0.44–1.00)
GFR calc non Af Amer: >60 mL/min (ref >60)
Glucose, Bld: 83 mg/dL (ref 65–99)
Potassium: 3.3 mmol/L — ABNORMAL LOW (ref 3.5–5.1)
SODIUM: 138 mmol/L (ref 135–145)
Total Bilirubin: 1.5 mg/dL — ABNORMAL HIGH (ref 0.3–1.2)
Total Protein: 6.9 g/dL (ref 6.5–8.1)

## 2016-03-29 LAB — LIPASE, BLOOD: Lipase: 20 U/L (ref 11–51)

## 2016-03-29 LAB — CBC
HCT: 44.3 % (ref 35.0–47.0)
HEMOGLOBIN: 15.3 g/dL (ref 12.0–16.0)
MCH: 31.2 pg (ref 26.0–34.0)
MCHC: 34.5 g/dL (ref 32.0–36.0)
MCV: 90.5 fL (ref 80.0–100.0)
PLATELETS: 450 10*3/uL — AB (ref 150–440)
RBC: 4.9 MIL/uL (ref 3.80–5.20)
RDW: 12.3 % (ref 11.5–14.5)
WBC: 13.7 10*3/uL — AB (ref 3.6–11.0)

## 2016-03-29 MED ORDER — PROMETHAZINE HCL 25 MG RE SUPP: 25.0000 mg | Freq: Four times a day (QID) | RECTAL | Status: DC | PRN

## 2016-03-29 MED ORDER — METOCLOPRAMIDE HCL 5 MG/ML IJ SOLN
10.0000 mg | Freq: Once | INTRAMUSCULAR | Status: AC
  Administered 2016-03-29: 10 mg via INTRAVENOUS
  Filled 2016-03-29 (×2): qty 2

## 2016-03-29 MED ORDER — SODIUM CHLORIDE 0.9 % IV BOLUS (SEPSIS)
1000.0000 mL | Freq: Once | INTRAVENOUS | Status: AC
  Administered 2016-03-29: 1000 mL via INTRAVENOUS

## 2016-03-29 MED ORDER — POTASSIUM CHLORIDE CRYS ER 20 MEQ PO TBCR
40.0000 meq | EXTENDED_RELEASE_TABLET | Freq: Once | ORAL | Status: AC
  Administered 2016-03-29: 40 meq via ORAL
  Filled 2016-03-29 (×2): qty 2

## 2016-03-29 MED ORDER — ONDANSETRON HCL 4 MG/2ML IJ SOLN
4.0000 mg | Freq: Once | INTRAMUSCULAR | Status: AC
  Administered 2016-03-29: 4 mg via INTRAVENOUS
  Filled 2016-03-29: qty 2

## 2016-03-29 NOTE — Discharge Instructions (Signed)
You were seen in the emergency room for nausea and vomiting. It is important that you follow up with Dr. Cyndie MullElliot's office tomorrow as scheduled.   Please return to the emergency room right away if you are to develop a fever, severe nausea, your pain becomes severe or worsens, you are unable to keep food down, begin vomiting any dark or bloody fluid, you develop any dark or bloody stools, feel dehydrated, or other new concerns or symptoms arise.

## 2016-03-29 NOTE — ED Provider Notes (Signed)
[facility] Emergency Department Provider Note  ____________________________________________  Time seen: Approximately 5:45 PM  I have reviewed the triage vital signs and the nursing notes.   HISTORY  Chief Complaint Emesis    HPI Renee Carter is a 20 y.o. female the history of migraine headaches and "abdominal migraines" suspected. Also history of gastritis with previous evaluation including endoscopy.  Patient reports that on Thursday she had her typical migraine headache. This is often associated with nausea and vomiting, and her headache is gone away but she is continuing to have severe nausea and whenever she eats anything she began vomiting.  She has not had any abdominal pain. No fever or chills. No chest pain or trouble breathing. Her headache is gone away. States she cannot tolerate oral Zofran because she does not like the taste, and she has tried Compazine suppositories without relief. No diarrhea. She is passing gas normally.  She reports she feels "dehydrated".  Patient and her mother both report that this is happened to her several times in the past, always occurring after a migraine headache and she usually requires IV fluids and IV nausea medicine to relieve symptoms and the "cycle" that occurs after headaches.  Denies pregnancy.  Past Medical History  Diagnosis Date  . Migraine     Patient Active Problem List   Diagnosis Date Noted  . Migraine     Past Surgical History  Procedure Laterality Date  . Knee surgery    . Esophagogastroduodenoscopy N/A 06/12/2015    Procedure: ESOPHAGOGASTRODUODENOSCOPY (EGD);  Surgeon: Scot Jun, MD;  Location: [facility] ENDOSCOPY;  Service: Endoscopy;  Laterality: N/A;    Current Outpatient Rx  Name  Route  Sig  Dispense  Refill  . omeprazole (PRILOSEC) 40 MG capsule   Oral   Take 40 mg by mouth daily.         . ondansetron (ZOFRAN ODT) 4 MG disintegrating tablet   Oral   Take 1 tablet (4  mg total) by mouth every 8 (eight) hours as needed for nausea or vomiting.   12 tablet   0   . promethazine (PHENERGAN) 25 MG suppository   Rectal   Place 1 suppository (25 mg total) rectally every 6 (six) hours as needed for nausea or vomiting.   20 each   0     Allergies Review of patient's allergies indicates no known allergies.  No family history on file.  Social History Social History  Substance Use Topics  . Smoking status: Current Some Day Smoker  . Smokeless tobacco: None  . Alcohol Use: Yes    Review of Systems Constitutional: No fever/chills. States she feels weak and fatigued due to being "dehydrated" Eyes: No visual changes. ENT: No sore throat. Cardiovascular: Denies chest pain. Respiratory: Denies shortness of breath. Slight dry cough for the last week. Gastrointestinal: No pain. No diarrhea.  No constipation. Genitourinary: Negative for dysuria. Musculoskeletal: Negative for back pain. Skin: Negative for rash. Neurological: Negative for headaches, focal weakness or numbness.  10-point ROS otherwise negative.  ____________________________________________   PHYSICAL EXAM:  VITAL SIGNS: ED Triage Vitals  Enc Vitals Group     BP 03/29/16 1606 138/93 mmHg     Pulse Rate 03/29/16 1606 66     Resp 03/29/16 1606 20     Temp 03/29/16 1606 97.5 F (36.4 C)     Temp Source 03/29/16 1606 Oral     SpO2 03/29/16 1606 97 %     Weight 03/29/16 1606  199 lb (90.266 kg)     Height 03/29/16 1606 5\' 8"  (1.727 m)     Head Cir --      Peak Flow --      Pain Score 03/29/16 1607 1     Pain Loc --      Pain Edu? --      Excl. in GC? --    Constitutional: Alert and oriented. Well appearing and in no acute distress. Eyes: Conjunctivae are normal. PERRL. EOMI. Head: Atraumatic. Nose: No congestion/rhinnorhea. Mouth/Throat: Mucous membranes are slightly dry.  Oropharynx non-erythematous. Neck: No stridor.   Cardiovascular: Normal rate, regular rhythm. Grossly  normal heart sounds.  Good peripheral circulation. Respiratory: Normal respiratory effort.  No retractions. Lungs CTAB. Gastrointestinal: Soft and nontender. No distention. Normal bowel sounds. No pain at McBurney's point. Negative Murphy. No rebound or guarding. No pain in any area of the abdomen. No CVA tenderness. Musculoskeletal: No lower extremity tenderness nor edema.  No joint effusions. Neurologic:  Normal speech and language. No gross focal neurologic deficits are appreciated. Normal cranial nerve exam. Skin:  Skin is warm, dry and intact. No rash noted. Psychiatric: Mood and affect are normal. Speech and behavior are normal.  ____________________________________________   LABS (all labs ordered are listed, but only abnormal results are displayed)  Labs Reviewed  CBC - Abnormal; Notable for the following:    WBC 13.7 (*)    Platelets 450 (*)    All other components within normal limits  COMPREHENSIVE METABOLIC PANEL - Abnormal; Notable for the following:    Potassium 3.3 (*)    Calcium 8.3 (*)    Total Bilirubin 1.5 (*)    All other components within normal limits  LIPASE, BLOOD   ____________________________________________  EKG   ____________________________________________  RADIOLOGY  No clear indication for abdominal imaging. Patient denies any abdominal pain, but rather reports that she is having severe nausea and vomiting whenever she tries to eat. She doesn't keep anything down. She has no evidence of peritonitis or focal abdominal discomfort. She reports she is still passing gas, and denies any feeling of distention or fullness. ____________________________________________   PROCEDURES  Procedure(s) performed: None  Critical Care performed: No  ____________________________________________   INITIAL IMPRESSION / ASSESSMENT AND PLAN / ED COURSE  Pertinent labs & imaging results that were available during my care of the patient were reviewed by me and  considered in my medical decision making (see chart for details).  Patient had normal urinalysis, no urinary symptoms today. Negative pregnancy test 3 days ago.  Present the patient has some type of nausea vomiting and gastritis like symptoms that seem to be associated with her migraine headaches. She reports this is occurred many times, and this is always seems to be associated with a recent migraine which she had on Thursday and is now gone. I wonder if she may have "abdominal migraines" per se, and we'll hydrate her here and treated with antiemetics which she reports that helped her in the past.  Overall clinically a very reassuring exam. Afebrile. No abdominal pain. No signs or symptoms suggest cholecystitis, appendicitis, pancreatitis, active or bleeding ulcer, or other acute intra-abdominal catastrophe. Not having any acute cardiac or pulmonary symptoms aside for a slight dry cough but clear lung sounds normal respirations and normal oxygen saturation today.  ----------------------------------------- 7:53 PM on 03/29/2016 -----------------------------------------  Lab results reviewed. Minimal leukocytosis, appears to be slightly chronic or seen previous on prior checks. Bilirubin just very slightly elevated, likely related to  vomiting. Again the patient has no associated abdominal pain. Potassium just minimally low 3.3. We'll replete here.  The patient's symptoms are much better. She is currently drinking water, taking ice chips and states her nausea is much better. She is able to take by mouth at this time. She has follow-up with gastroenterology, Dr. Earnest Conroy office planned for tomorrow.  She appears nontoxic, stable, reassuring labs and examination at this time. Discussed with the patient and her family and we will provide her prescription for Phenergan suppository, and we discuss side effects of this medication, and appropriate use. She'll stop using her other Compazine suppositories  while using Phenergan. She has ample supply of Zofran at home.  Careful abdominal pain return precautions discussed with the patient and her family who are very agreeable. ____________________________________________   FINAL CLINICAL IMPRESSION(S) / ED DIAGNOSES  Final diagnoses:  Abdominal migraine, not intractable  nausea and vomiting, not intractable    Sharyn Creamer, MD 03/29/16 2327

## 2016-03-29 NOTE — ED Notes (Signed)
Reports n/v since last Thursday.

## 2016-03-29 NOTE — ED Notes (Signed)
Pt provided water to drink. OK per Dr. Fanny BienQuale.

## 2016-03-29 NOTE — ED Notes (Signed)
E-signature for this RN not working. E-signature page printed out and pt signed on paper. Pt verbalized understanding of discharge instructions and had no further questions.

## 2016-03-30 ENCOUNTER — Other Ambulatory Visit: Payer: Self-pay | Admitting: Nurse Practitioner

## 2016-03-30 DIAGNOSIS — R112 Nausea with vomiting, unspecified: Secondary | ICD-10-CM

## 2016-04-01 ENCOUNTER — Ambulatory Visit
Admission: RE | Admit: 2016-04-01 | Discharge: 2016-04-01 | Disposition: A | Discharge status: Home / Self Care | Payer: BC Managed Care – PPO | Source: Ambulatory Visit | Attending: Nurse Practitioner | Admitting: Nurse Practitioner

## 2016-04-01 ENCOUNTER — Observation Stay
Admission: EM | Admit: 2016-04-01 | Discharge: 2016-04-03 | Disposition: A | Discharge status: Home / Self Care | Payer: BC Managed Care – PPO | Attending: Internal Medicine | Admitting: Internal Medicine

## 2016-04-01 ENCOUNTER — Emergency Department: Payer: BC Managed Care – PPO

## 2016-04-01 DIAGNOSIS — R05 Cough: Secondary | ICD-10-CM | POA: Insufficient documentation

## 2016-04-01 DIAGNOSIS — R110 Nausea: Secondary | ICD-10-CM | POA: Diagnosis present

## 2016-04-01 DIAGNOSIS — K2900 Acute gastritis without bleeding: Secondary | ICD-10-CM | POA: Diagnosis not present

## 2016-04-01 DIAGNOSIS — F1290 Cannabis use, unspecified, uncomplicated: Secondary | ICD-10-CM | POA: Diagnosis not present

## 2016-04-01 DIAGNOSIS — F17200 Nicotine dependence, unspecified, uncomplicated: Secondary | ICD-10-CM | POA: Insufficient documentation

## 2016-04-01 DIAGNOSIS — E872 Acidosis: Principal | ICD-10-CM | POA: Insufficient documentation

## 2016-04-01 DIAGNOSIS — D72823 Leukemoid reaction: Secondary | ICD-10-CM | POA: Diagnosis not present

## 2016-04-01 DIAGNOSIS — N390 Urinary tract infection, site not specified: Secondary | ICD-10-CM | POA: Insufficient documentation

## 2016-04-01 DIAGNOSIS — G43909 Migraine, unspecified, not intractable, without status migrainosus: Secondary | ICD-10-CM | POA: Insufficient documentation

## 2016-04-01 DIAGNOSIS — T730XXA Starvation, initial encounter: Secondary | ICD-10-CM | POA: Diagnosis not present

## 2016-04-01 DIAGNOSIS — R17 Unspecified jaundice: Secondary | ICD-10-CM | POA: Insufficient documentation

## 2016-04-01 DIAGNOSIS — R112 Nausea with vomiting, unspecified: Secondary | ICD-10-CM | POA: Diagnosis not present

## 2016-04-01 DIAGNOSIS — X58XXXA Exposure to other specified factors, initial encounter: Secondary | ICD-10-CM | POA: Diagnosis not present

## 2016-04-01 DIAGNOSIS — E860 Dehydration: Secondary | ICD-10-CM | POA: Diagnosis not present

## 2016-04-01 DIAGNOSIS — Z79899 Other long term (current) drug therapy: Secondary | ICD-10-CM | POA: Insufficient documentation

## 2016-04-01 DIAGNOSIS — E876 Hypokalemia: Secondary | ICD-10-CM | POA: Diagnosis not present

## 2016-04-01 DIAGNOSIS — D72829 Elevated white blood cell count, unspecified: Secondary | ICD-10-CM | POA: Insufficient documentation

## 2016-04-01 DIAGNOSIS — K210 Gastro-esophageal reflux disease with esophagitis: Secondary | ICD-10-CM | POA: Insufficient documentation

## 2016-04-01 DIAGNOSIS — R1110 Vomiting, unspecified: Secondary | ICD-10-CM

## 2016-04-01 LAB — URINALYSIS COMPLETE WITH MICROSCOPIC (ARMC ONLY)
Bilirubin Urine: NEGATIVE
Glucose, UA: NEGATIVE mg/dL
LEUKOCYTES UA: NEGATIVE
NITRITE: NEGATIVE
PROTEIN: 100 mg/dL — AB
Specific Gravity, Urine: 1.024 (ref 1.005–1.030)
pH: 6 (ref 5.0–8.0)

## 2016-04-01 LAB — CBC
HCT: 45.9 % (ref 35.0–47.0)
HEMOGLOBIN: 15.7 g/dL (ref 12.0–16.0)
MCH: 31.4 pg (ref 26.0–34.0)
MCHC: 34.2 g/dL (ref 32.0–36.0)
MCV: 91.7 fL (ref 80.0–100.0)
PLATELETS: 415 10*3/uL (ref 150–440)
RBC: 5 MIL/uL (ref 3.80–5.20)
RDW: 12.1 % (ref 11.5–14.5)
WBC: 14.1 10*3/uL — AB (ref 3.6–11.0)

## 2016-04-01 LAB — COMPREHENSIVE METABOLIC PANEL
ALK PHOS: 77 U/L (ref 38–126)
ALT: 23 U/L (ref 14–54)
ANION GAP: 17 — AB (ref 5–15)
AST: 16 U/L (ref 15–41)
Albumin: 4.5 g/dL (ref 3.5–5.0)
BUN: 11 mg/dL (ref 6–20)
CALCIUM: 9.5 mg/dL (ref 8.9–10.3)
CO2: 21 mmol/L — AB (ref 22–32)
Chloride: 99 mmol/L — ABNORMAL LOW (ref 101–111)
Creatinine, Ser: 0.8 mg/dL (ref 0.44–1.00)
Glucose, Bld: 85 mg/dL (ref 65–99)
Potassium: 3.4 mmol/L — ABNORMAL LOW (ref 3.5–5.1)
Sodium: 137 mmol/L (ref 135–145)
TOTAL PROTEIN: 7.9 g/dL (ref 6.5–8.1)
Total Bilirubin: 2.4 mg/dL — ABNORMAL HIGH (ref 0.3–1.2)

## 2016-04-01 LAB — BETA-HYDROXYBUTYRIC ACID: Beta-Hydroxybutyric Acid: 4.06 mmol/L — ABNORMAL HIGH (ref 0.05–0.27)

## 2016-04-01 LAB — LIPASE, BLOOD: LIPASE: 21 U/L (ref 11–51)

## 2016-04-01 LAB — SALICYLATE LEVEL: Salicylate Lvl: <4 mg/dL (ref 2.8–30.0)

## 2016-04-01 LAB — CK: Total CK: 43 U/L (ref 38–234)

## 2016-04-01 LAB — PREGNANCY, URINE: PREG TEST UR: NEGATIVE

## 2016-04-01 LAB — LACTIC ACID, PLASMA
LACTIC ACID, VENOUS: 1 mmol/L (ref 0.5–2.0)
Lactic Acid, Venous: 2.1 mmol/L (ref 0.5–2.0)

## 2016-04-01 MED ORDER — METOCLOPRAMIDE HCL 5 MG/ML IJ SOLN
10.0000 mg | Freq: Once | INTRAMUSCULAR | Status: AC
  Administered 2016-04-01: 10 mg via INTRAVENOUS

## 2016-04-01 MED ORDER — ONDANSETRON HCL 4 MG/2ML IJ SOLN
INTRAMUSCULAR | Status: AC
  Administered 2016-04-01: 4 mg via INTRAVENOUS
  Filled 2016-04-01: qty 2

## 2016-04-01 MED ORDER — SODIUM CHLORIDE 0.9 % IV BOLUS (SEPSIS)
1000.0000 mL | Freq: Once | INTRAVENOUS | Status: AC
  Administered 2016-04-01: 1000 mL via INTRAVENOUS

## 2016-04-01 MED ORDER — POTASSIUM CHLORIDE IN NACL 20-0.9 MEQ/L-% IV SOLN
INTRAVENOUS | Status: DC
  Administered 2016-04-01 – 2016-04-02 (×3): via INTRAVENOUS
  Filled 2016-04-01 (×5): qty 1000

## 2016-04-01 MED ORDER — POTASSIUM CHLORIDE CRYS ER 20 MEQ PO TBCR
40.0000 meq | EXTENDED_RELEASE_TABLET | Freq: Once | ORAL | Status: AC
  Administered 2016-04-01: 40 meq via ORAL
  Filled 2016-04-01: qty 2

## 2016-04-01 MED ORDER — ACETAMINOPHEN 650 MG RE SUPP: 650.0000 mg | Freq: Four times a day (QID) | RECTAL | Status: DC | PRN

## 2016-04-01 MED ORDER — ONDANSETRON HCL 4 MG PO TABS: 4.0000 mg | ORAL_TABLET | Freq: Four times a day (QID) | ORAL | Status: DC | PRN

## 2016-04-01 MED ORDER — PANTOPRAZOLE SODIUM 40 MG IV SOLR
40.0000 mg | Freq: Two times a day (BID) | INTRAVENOUS | Status: DC
  Administered 2016-04-01 – 2016-04-03 (×4): 40 mg via INTRAVENOUS
  Filled 2016-04-01 (×4): qty 40

## 2016-04-01 MED ORDER — ONDANSETRON HCL 4 MG/2ML IJ SOLN
4.0000 mg | Freq: Once | INTRAMUSCULAR | Status: AC
  Administered 2016-04-01: 4 mg via INTRAVENOUS

## 2016-04-01 MED ORDER — ONDANSETRON 8 MG PO TBDP: 4.0000 mg | ORAL_TABLET | Freq: Three times a day (TID) | ORAL | Status: DC | PRN

## 2016-04-01 MED ORDER — METOCLOPRAMIDE HCL 5 MG/ML IJ SOLN
INTRAMUSCULAR | Status: AC
  Administered 2016-04-01: 10 mg via INTRAVENOUS
  Filled 2016-04-01: qty 2

## 2016-04-01 MED ORDER — DIPHENHYDRAMINE HCL 50 MG/ML IJ SOLN
12.5000 mg | Freq: Once | INTRAMUSCULAR | Status: AC
  Administered 2016-04-01: 12.5 mg via INTRAVENOUS
  Filled 2016-04-01: qty 1

## 2016-04-01 MED ORDER — ONDANSETRON HCL 4 MG/2ML IJ SOLN
4.0000 mg | Freq: Four times a day (QID) | INTRAMUSCULAR | Status: DC | PRN
  Administered 2016-04-01: 4 mg via INTRAVENOUS
  Filled 2016-04-01: qty 2

## 2016-04-01 MED ORDER — ACETAMINOPHEN 325 MG PO TABS: 650.0000 mg | ORAL_TABLET | Freq: Four times a day (QID) | ORAL | Status: DC | PRN

## 2016-04-01 MED ORDER — PROMETHAZINE HCL 25 MG RE SUPP: 25.0000 mg | Freq: Four times a day (QID) | RECTAL | Status: DC | PRN

## 2016-04-01 MED ORDER — ENOXAPARIN SODIUM 40 MG/0.4ML ~~LOC~~ SOLN
40.0000 mg | SUBCUTANEOUS | Status: DC
  Administered 2016-04-01 – 2016-04-02 (×2): 40 mg via SUBCUTANEOUS
  Filled 2016-04-01 (×2): qty 0.4

## 2016-04-01 MED ORDER — SENNOSIDES-DOCUSATE SODIUM 8.6-50 MG PO TABS: 1.0000 | ORAL_TABLET | Freq: Every evening | ORAL | Status: DC | PRN

## 2016-04-01 MED ORDER — PNEUMOCOCCAL VAC POLYVALENT 25 MCG/0.5ML IJ INJ: 0.5000 mL | INJECTION | INTRAMUSCULAR | Status: DC

## 2016-04-01 NOTE — Progress Notes (Signed)
Pt requesting something to help her fall asleep - MD notified - orders placed.

## 2016-04-01 NOTE — H&P (Signed)
[facility] [place] [facility]   PATIENT NAME: Renee Carter    MR#:  [id number]  DATE OF BIRTH:  1996/11/12  DATE OF ADMISSION:  04/01/2016  PRIMARY CARE PHYSICIAN: Pcp Not In System   REQUESTING/REFERRING PHYSICIAN: 04/01/2016   CHIEF COMPLAINT:   Intractable nausea and vomiting HISTORY OF PRESENT ILLNESS:  Renee Carter  is a 20 y.o. female with a known history of Migraine headache and ulcer in 2016 who presents with above complaint. Over the past few weeks patient has had nausea and vomiting. She was seen in the emergency room about a week ago with a migraine headache and nausea and vomiting. She was seen by GI as an outpatient on April 2 due to persistent nausea and vomiting. She presents today to emergency room for intractable nausea and vomiting. She has not been able to eat for over a week. She also has not had a bowel movement since Friday. She is denying active abdominal pain. She denies melena or hematochezia.  PAST MEDICAL HISTORY:   Past Medical History  Diagnosis Date  . Migraine     PAST SURGICAL HISTORY:   Past Surgical History  Procedure Laterality Date  . Knee surgery    . Esophagogastroduodenoscopy N/A 06/12/2015    Procedure: ESOPHAGOGASTRODUODENOSCOPY (EGD);  Surgeon: Scot Jun, MD;  Location: [facility] ENDOSCOPY;  Service: Endoscopy;  Laterality: N/A;    SOCIAL HISTORY:   Social History  Substance Use Topics  . Smoking status: Current Some Day Smoker  . Smokeless tobacco: Not on file  . Alcohol Use: Yes    FAMILY HISTORY:  No family history on file.  DRUG ALLERGIES:  No Known Allergies  REVIEW OF SYSTEMS:   Review of Systems  Constitutional: Negative for fever, chills and malaise/fatigue.  HENT: Negative for ear discharge, ear pain, hearing loss, nosebleeds and sore throat.   Eyes: Negative for blurred vision and pain.  Respiratory: Negative for cough, hemoptysis, shortness of breath and wheezing.   Cardiovascular:  Negative for chest pain, palpitations and leg swelling.  Gastrointestinal: Positive for nausea and vomiting. Negative for abdominal pain, diarrhea and blood in stool.  Genitourinary: Negative for dysuria.  Musculoskeletal: Negative for back pain.  Neurological: Negative for dizziness, tremors, speech change, focal weakness, seizures and headaches.  Endo/Heme/Allergies: Does not bruise/bleed easily.  Psychiatric/Behavioral: Negative for depression, suicidal ideas and hallucinations.    MEDICATIONS AT HOME:   Prior to Admission medications   Medication Sig Start Date End Date Taking? Authorizing Provider  omeprazole (PRILOSEC) 40 MG capsule Take 40 mg by mouth daily.    Historical Provider, MD  ondansetron (ZOFRAN ODT) 4 MG disintegrating tablet Take 1 tablet (4 mg total) by mouth every 8 (eight) hours as needed for nausea or vomiting. 03/26/16   Gayla Doss, MD  promethazine (PHENERGAN) 25 MG suppository Place 1 suppository (25 mg total) rectally every 6 (six) hours as needed for nausea or vomiting. 03/29/16   Sharyn Creamer, MD      VITAL SIGNS:  Blood pressure 119/75, pulse 92, resp. rate 16, height  (1.727 m), weight 89.359 kg (197 lb), last menstrual period 03/29/2016, SpO2 97 %.  PHYSICAL EXAMINATION:   Physical Exam  Constitutional: She is oriented to person, place, and time and well-developed, well-nourished, and in no distress. No distress.  HENT:  Head: Normocephalic.  Eyes: No scleral icterus.  Neck: Normal range of motion. Neck supple. No JVD present. No tracheal deviation present.  Cardiovascular: Normal rate, regular rhythm  and normal heart sounds.  Exam reveals no gallop and no friction rub.   No murmur heard. Pulmonary/Chest: Effort normal and breath sounds normal. No respiratory distress. She has no wheezes. She has no rales. She exhibits no tenderness.  Abdominal: Soft. Bowel sounds are normal. She exhibits no distension and no mass. There is no tenderness. There is  no rebound and no guarding.  Musculoskeletal: Normal range of motion. She exhibits no edema.  Neurological: She is alert and oriented to person, place, and time.  Skin: Skin is warm. No rash noted. No erythema.  Psychiatric: Affect and judgment normal.      LABORATORY PANEL:   CBC  Recent Labs Lab 04/01/16 1556  WBC 14.1*  HGB 15.7  HCT 45.9  PLT 415   ------------------------------------------------------------------------------------------------------------------  Chemistries   Recent Labs Lab 04/01/16 1556  NA 137  K 3.4*  CL 99*  CO2 21*  GLUCOSE 85  BUN 11  CREATININE 0.80  CALCIUM 9.5  AST 16  ALT 23  ALKPHOS 77  BILITOT 2.4*   ------------------------------------------------------------------------------------------------------------------  Cardiac Enzymes No results for input(s): TROPONINI in the last 168 hours. ------------------------------------------------------------------------------------------------------------------  RADIOLOGY:  [place] Abdomen Limited Ruq  04/01/2016  CLINICAL DATA:  Patient with nausea and vomiting. Hyperbilirubinemia. Right upper quadrant for 1 week. EXAM: US ABDOMEN LIMITED - RIGHT UPPER QUADRANT COMPARISON:  CT abdomen pelvis 05/28/2015. FINDINGS: Gallbladder: Probable small amount of sludge in the gallbladder lumen. No gallbladder wall thickening or pericholecystic fluid. Negative sonographic Murphy's sign. Common bile duct: Diameter: 5 mm Liver: No focal lesion identified. Within normal limits in parenchymal echogenicity. IMPRESSION: Small amount of gallbladder sludge. No sonographic evidence to suggest cholecystitis. No biliary ductal dilatation. Electronically Signed   By: Annia Beltrew  Davis M.D.   On: 04/01/2016 09:10    EKG:  No orders found for this or any previous visit.  IMPRESSION AND PLAN:   20 year old female with intractable nausea and vomiting.  1. Intractable nausea and vomiting: Right upper quadrant ultrasound does  show gallbladder sludge Patient will likely need EGD to evaluate for underlying ulcer. If EGD is normal then should be evaluated for gallbladder disease. Start Protonix 40 IV every 12. Continue supportive care with antiemetics. GI consult in a.m. Clear liquid diet and nothing by mouth after midnight. Follow up on abdominal x-ray.   2. Hypokalemia: This is due to nausea and vomiting. Replete potassium and recheck in a.m.   3. Leukocytosis: This is leukemoid reaction from dehydration.   All the records are reviewed and case discussed with ED provider. Management plans discussed with the patient and shein agreement  CODE STATUS: FULL  TOTAL TIME TAKING CARE OF THIS PATIENT: 45 minutes.    Urvi Imes M.D on 04/01/2016 at 5:35 PM  Between 7am to 6pm - Pager - [phone]  After 6pm go to [email or web address] - password [email or web address]  [facility]  Office  [phone]  CC: Primary care physician; Pcp Not In System

## 2016-04-01 NOTE — ED Provider Notes (Signed)
[facility] Emergency Department Provider Note  ____________________________________________  Time seen: Approximately 5:05 PM  I have reviewed the triage vital signs and the nursing notes.   HISTORY  Chief Complaint Emesis    HPI Renee Carter is a 20 y.o. female resents today states that she is having ongoing basically same symptoms this was seen for on May 1.  She went to the GI clinic, they ordered an ultrasound today and told he might be some sludge in gallbladder. She continues to have nausea, vomiting, night able to keep anything down. Has tried Zofran, Phenergan suppositories.  She reports the GI doctors told her to come in today for IV fluids.  She denies being in pain. Just feels very nauseated. Reports her last bowel movement might of been a week ago, but states she just hasn't had hardly anything to eat.  Past Medical History  Diagnosis Date  . Migraine     Patient Active Problem List   Diagnosis Date Noted  . Nausea 04/01/2016  . Migraine     Past Surgical History  Procedure Laterality Date  . Knee surgery    . Esophagogastroduodenoscopy N/A 06/12/2015    Procedure: ESOPHAGOGASTRODUODENOSCOPY (EGD);  Surgeon: Scot Junobert T Elliott, MD;  [facility] END[facility];  Service: Endoscopy;  Laterality: N/A;    No current outpatient prescriptions on file.  Allergies Review of patient's allergies indicates no known allergies.  No family history on file.  Social History Social History  Substance Use Topics  . Smoking status: Current Some Day Smoker  . Smokeless tobacco: Not on file  . Alcohol Use: Yes    Review of Systems Constitutional: No fever/chills. Feels weak and "dehydrated" Eyes: No visual changes. ENT: No sore throat. Cardiovascular: Denies chest pain. Respiratory: Denies shortness of breath. Gastrointestinal: No abdominal pain.  Denies feeling constipated or swollen. Genitourinary: Negative for dysuria. Musculoskeletal:  Negative for back pain. Skin: Negative for rash. Neurological: Negative for headaches, focal weakness or numbness.  10-point ROS otherwise negative.  ____________________________________________   PHYSICAL EXAM:  VITAL SIGNS: ED Triage Vitals  Enc Vitals Group     BP 04/01/16 1553 119/75 mmHg     Pulse Rate 04/01/16 1553 92     Resp 04/01/16 1553 16     Temp --      Temp src --      SpO2 04/01/16 1553 97 %     Weight 04/01/16 1553 197 lb (89.359 kg)     Height 04/01/16 1553 5\' 8"  (1.727 m)     Head Cir --      Peak Flow --      Pain Score 04/01/16 1554 2     Pain Loc --      Pain Edu? --      Excl. in GC? --    Constitutional: Alert and oriented. Well appearing and in no acute distress.Does appear to slightly fatigued though. Eyes: Conjunctivae are normal. PERRL. EOMI. Head: Atraumatic. Nose: No congestion/rhinnorhea. Mouth/Throat: Mucous membranes are Slightly dry.  Oropharynx non-erythematous. Neck: No stridor.   Cardiovascular: Normal rate, regular rhythm. Grossly normal heart sounds.  Good peripheral circulation. Respiratory: Normal respiratory effort.  No retractions. Lungs CTAB. Gastrointestinal: Soft and nontenderExcept for some very minimal epigastric tenderness without rebound or guarding. No distention. No abdominal bruits. No CVA tenderness. Musculoskeletal: No lower extremity tenderness nor edema.  No joint effusions. Neurologic:  Normal speech and language. No gross focal neurologic deficits are appreciated. No gait instability. Skin:  Skin is  warm, dry and intact. No rash noted. Psychiatric: Mood and affect are normal. Speech and behavior are normal.  ____________________________________________   LABS (all labs ordered are listed, but only abnormal results are displayed)  Labs Reviewed  COMPREHENSIVE METABOLIC PANEL - Abnormal; Notable for the following:    Potassium 3.4 (*)    Chloride 99 (*)    CO2 21 (*)    Total Bilirubin 2.4 (*)    Anion gap  17 (*)    All other components within normal limits  CBC - Abnormal; Notable for the following:    WBC 14.1 (*)    All other components within normal limits  URINALYSIS COMPLETEWITH MICROSCOPIC ([facility] ONLY) - Abnormal; Notable for the following:    Color, Urine YELLOW (*)    APPearance HAZY (*)    Ketones, ur 2+ (*)    Hgb urine dipstick 1+ (*)    Protein, ur 100 (*)    Bacteria, UA FEW (*)    Squamous Epithelial / LPF 0-5 (*)    All other components within normal limits  LACTIC ACID, PLASMA - Abnormal; Notable for the following:    Lactic Acid, Venous 2.1 (*)    All other components within normal limits  BETA-HYDROXYBUTYRIC ACID - Abnormal; Notable for the following:    Beta-Hydroxybutyric Acid 4.06 (*)    All other components within normal limits  LIPASE, BLOOD  PREGNANCY, URINE  LACTIC ACID, PLASMA  CK  SALICYLATE LEVEL  COMPREHENSIVE METABOLIC PANEL  CBC   ____________________________________________  EKG   ____________________________________________  RADIOLOGY  DG Abd 2 Views (Final result) Result time: 04/01/16 17:43:14   Final result by Rad Results In Interface (04/01/16 17:43:14)   Narrative:   CLINICAL DATA: Pt here with c/o vomiting x 1 week and no bowel movement; seen here last week for the same. F/U with MD today and was sent here to get some fluids. Pt reports low grade fever last night, no fever today.  EXAM: ABDOMEN - 2 VIEW  COMPARISON: 05/23/2015  FINDINGS: The bowel gas pattern is normal. There is no evidence of free air. No radio-opaque calculi or other significant radiographic abnormality is seen.  IMPRESSION: Negative.   Electronically Signed By: Amie Portland M.D. On: 04/01/2016 17:43    ____________________________________________   PROCEDURES  Procedure(s) performed: None  Critical Care performed: No  ____________________________________________   INITIAL IMPRESSION / ASSESSMENT AND PLAN / ED  COURSE  Pertinent labs & imaging results that were available during my care of the patient were reviewed by me and considered in my medical decision making (see chart for details).  Patient presents with ongoing nausea, inability to keep food down. Case discussed with Dr. Mechele Collin of gastroenterology who recommends hydration, antibiotics and admission given failed outpatient treatment. He does not recommend any further imaging, or CT of this time. Patient had a right upper quadrant ultrasound ordered from his office today which was largely normal except for some slight sludge. Her labs are reassuring, slightly elevated T bili but normal LFTs otherwise no alkaline phosphatase. Anion gap is noted at 17, and her glucose is normal, beta hydroxybutyrate level is elevated suggestive of starvation ketosis.  Based on the patient's unremitting nausea, no evidence of elevated anion gap and starvation ketosis we will admit the patient for ongoing care and further workup for intractable nausea. ____________________________________________   FINAL CLINICAL IMPRESSION(S) / ED DIAGNOSES  Final diagnoses:  Intractable vomiting with nausea, vomiting of unspecified type  Starvation ketoacidosis  Sharyn Creamer, MD 04/01/16 [phone]

## 2016-04-01 NOTE — ED Notes (Signed)
Pt here with c/o vomiting; seen here last week for the same. F/U with MD today and was sent here to get some fluids. Pt reports low grade fever last night, no fever today. Denies headache, had US for gallbaldder and showed some sludge.

## 2016-04-01 NOTE — Progress Notes (Signed)
Notified Dr. Sheryle Hailiamond of critical lactic acid of 2.1. Reviewed fluids given in ER and maintenance fluids now. Order to hold NaK fluids and give another liter of NS.

## 2016-04-02 ENCOUNTER — Encounter: Payer: Self-pay | Admitting: Nurse Practitioner

## 2016-04-02 LAB — CBC
HEMATOCRIT: 43.9 % (ref 35.0–47.0)
HEMOGLOBIN: 15 g/dL (ref 12.0–16.0)
MCH: 31.6 pg (ref 26.0–34.0)
MCHC: 34.2 g/dL (ref 32.0–36.0)
MCV: 92.4 fL (ref 80.0–100.0)
Platelets: 368 10*3/uL (ref 150–440)
RBC: 4.75 MIL/uL (ref 3.80–5.20)
RDW: 12.4 % (ref 11.5–14.5)
WBC: 12.1 10*3/uL — AB (ref 3.6–11.0)

## 2016-04-02 LAB — COMPREHENSIVE METABOLIC PANEL
ALT: 21 U/L (ref 14–54)
ANION GAP: 14 (ref 5–15)
AST: 16 U/L (ref 15–41)
Albumin: 4.2 g/dL (ref 3.5–5.0)
Alkaline Phosphatase: 69 U/L (ref 38–126)
BUN: 7 mg/dL (ref 6–20)
CHLORIDE: 103 mmol/L (ref 101–111)
CO2: 21 mmol/L — AB (ref 22–32)
CREATININE: 0.73 mg/dL (ref 0.44–1.00)
Calcium: 8.8 mg/dL — ABNORMAL LOW (ref 8.9–10.3)
GFR calc non Af Amer: >60 mL/min (ref >60)
Glucose, Bld: 80 mg/dL (ref 65–99)
POTASSIUM: 3.8 mmol/L (ref 3.5–5.1)
SODIUM: 138 mmol/L (ref 135–145)
Total Bilirubin: 2.7 mg/dL — ABNORMAL HIGH (ref 0.3–1.2)
Total Protein: 7.2 g/dL (ref 6.5–8.1)

## 2016-04-02 MED ORDER — DEXTROSE 5 % IV SOLN
1.0000 g | INTRAVENOUS | Status: DC
  Administered 2016-04-02 – 2016-04-03 (×2): 1 g via INTRAVENOUS
  Filled 2016-04-02 (×3): qty 10

## 2016-04-02 MED ORDER — DIPHENHYDRAMINE HCL 25 MG PO CAPS
25.0000 mg | ORAL_CAPSULE | Freq: Every evening | ORAL | Status: DC | PRN
  Administered 2016-04-02: 25 mg via ORAL
  Filled 2016-04-02: qty 1

## 2016-04-02 NOTE — Progress Notes (Signed)
[facility] - [place] at [facility]   PATIENT NAME: Renee Carter    MR#:  [id number]  DATE OF BIRTH:  1996-07-25  SUBJECTIVE:admitted for nausea/vomiting/poor po intake for one week. Patient has no abdominal pain. Started on IV fluids , nausea medications. Patient feels much better today. No nausea. No further vomiting.   CHIEF COMPLAINT:   Chief Complaint  Patient presents with  . Emesis    REVIEW OF SYSTEMS:   ROS CONSTITUTIONAL: No fever, fatigue or weakness.  EYES: No blurred or double vision.  EARS, NOSE, AND THROAT: No tinnitus or ear pain.  RESPIRATORY: No cough, shortness of breath, wheezing or hemoptysis.  CARDIOVASCULAR: No chest pain, orthopnea, edema.  GASTROINTESTINAL: No nausea, vomiting, diarrhea or abdominal pain.  GENITOURINARY: No dysuria, hematuria.  ENDOCRINE: No polyuria, nocturia,  HEMATOLOGY: No anemia, easy bruising or bleeding SKIN: No rash or lesion. MUSCULOSKELETAL: No joint pain or arthritis.   NEUROLOGIC: No tingling, numbness, weakness.  PSYCHIATRY: No anxiety or depression.   DRUG ALLERGIES:  No Known Allergies  VITALS:  Blood pressure 135/82, pulse 64, temperature 98.1 F (36.7 C), temperature source Oral, resp. rate 16, height  (1.727 m), weight 89.359 kg (197 lb), last menstrual period 03/29/2016, SpO2 98 %.  PHYSICAL EXAMINATION:  GENERAL:  20 y.o.-year-old patient lying in the bed with no acute distress.  EYES: Pupils equal, round, reactive to light and accommodation. No scleral icterus. Extraocular muscles intact.  HEENT: Head atraumatic, normocephalic. Oropharynx and nasopharynx clear.  NECK:  Supple, no jugular venous distention. No thyroid enlargement, no tenderness.  LUNGS: Normal breath sounds bilaterally, no wheezing, rales,rhonchi or crepitation. No use of accessory muscles of respiration.  CARDIOVASCULAR: S1, S2 normal. No murmurs, rubs, or gallops.  ABDOMEN: Soft, nontender, nondistended. Bowel  sounds present. No organomegaly or mass.  EXTREMITIES: No pedal edema, cyanosis, or clubbing.  NEUROLOGIC: Cranial nerves II through XII are intact. Muscle strength 5/5 in all extremities. Sensation intact. Gait not checked.  PSYCHIATRIC: The patient is alert and oriented x 3.  SKIN: No obvious rash, lesion, or ulcer.    LABORATORY PANEL:   CBC  Recent Labs Lab 04/02/16 0435  WBC 12.1*  HGB 15.0  HCT 43.9  PLT 368   ------------------------------------------------------------------------------------------------------------------  Chemistries   Recent Labs Lab 04/02/16 0435  NA 138  K 3.8  CL 103  CO2 21*  GLUCOSE 80  BUN 7  CREATININE 0.73  CALCIUM 8.8*  AST 16  ALT 21  ALKPHOS 69  BILITOT 2.7*   ------------------------------------------------------------------------------------------------------------------  Cardiac Enzymes No results for input(s): TROPONINI in the last 168 hours. ------------------------------------------------------------------------------------------------------------------  RADIOLOGY:  Dg Abd 2 Views  04/01/2016  CLINICAL DATA:  Pt here with c/o vomiting x 1 week and no bowel movement; seen here last week for the same. F/U with MD today and was sent here to get some fluids. Pt reports low grade fever last night, no fever today. EXAM: ABDOMEN - 2 VIEW COMPARISON:  05/23/2015 FINDINGS: The bowel gas pattern is normal. There is no evidence of free air. No radio-opaque calculi or other significant radiographic abnormality is seen. IMPRESSION: Negative. Electronically Signed   By: Amie Portland M.D.   On: 04/01/2016 17:43   US Abdomen Limited Ruq  04/01/2016  CLINICAL DATA:  Patient with nausea and vomiting. Hyperbilirubinemia. Right upper quadrant for 1 week. EXAM: US ABDOMEN LIMITED - RIGHT UPPER QUADRANT COMPARISON:  CT abdomen pelvis 05/28/2015. FINDINGS: Gallbladder: Probable small amount of sludge in the gallbladder  lumen. No gallbladder wall  thickening or pericholecystic fluid. Negative sonographic Murphy's sign. Common bile duct: Diameter: 5 mm Liver: No focal lesion identified. Within normal limits in parenchymal echogenicity. IMPRESSION: Small amount of gallbladder sludge. No sonographic evidence to suggest cholecystitis. No biliary ductal dilatation. Electronically Signed   By: Annia Beltrew  Davis M.D.   On: 04/01/2016 09:10    EKG:  No orders found for this or any previous visit.  ASSESSMENT AND PLAN:   1. intractable nausea, vomiting possibly secondary to acute gastritis: Started on IV hydration, IV nausea medicine, IV PPIs. Patient chronically is feeling much better. Had EGD done last year showing esophagitis. GI consult is pending. And family is requesting to speak to GI doctor before she starts to eat.   2 .hypokalemia secondary to nausea, vomiting: Continue to replace potassium. Improving. #3 UTI: Patient started on Rocephin today. Follow urine cultures. Sepsis present on admission with elevated count and lactic acid  Discussed with mother..  are reviewed and case discussed with Care Management/Social Workerr. Management plans discussed with the patient, family and they are in agreement.  CODE STATUS: full  TOTAL TIME TAKING CARE OF THIS PATIENT: 35minutes.   POSSIBLE D/C IN 1-2DAYS, DEPENDING ON CLINICAL CONDITION.   Katha HammingKONIDENA,Mattthew Ziomek M.D on 04/02/2016 at 11:02 AM  Between 7am to 6pm - Pager - [phone]  After 6pm go to [email or web address] - password [email or web address]  [facility]  Office  [phone]  CC: Primary care physician; Pcp Not In System   Note: This dictation was prepared with Dragon dictation along with smaller phrase technology. Any transcriptional errors that result from this process are unintentional.

## 2016-04-02 NOTE — Consult Note (Signed)
See full consult by Fransico SettersKim Mills, NP.  Pt with migraine headaches twice a year, recent problems with this and severe vomiting and epigastric abd pain.  Pt had 3 visits to ER and was admitted after consultation with me on the phone by ER physician.  Plan EGD tomorrow to assess stomach pathology. Pt had same test 9 months ago.

## 2016-04-02 NOTE — Consult Note (Signed)
Consultation  Referring Provider: Dr. Patricia Pesa Primary Care Physician:  Pcp Not In System Consulting  Gastroenterologist:  Dr. Lynnae Prude       Reason for Consultation:     Intractable N/V        HPI:   Renee Carter is a 20 y.o. female with a known history of migraine headache, reflux esophagitis  and erosive gastritis was referred to the emergency room for intractable nausea and vomiting. She called our office reporting no improvement with PPI bid, Carafate, Zofran, clear liquid diet. She has not been able to eat for over a week. She also has not had a bowel movement since Friday. She is denying active abdominal pain. She denies melena or hematochezia.   She gets a migraine HA maybe twice a year. She takes sumatriptan at the first sign of the aura. She denies any NSAID use. She does smoke marijuana daily for years maybe for chronic nausea.  She was seen in the emergency room 03/26/2016 for migraine headache, and then 6 episodes of non-bloody non bilious emesis.  No abdominal pain fevers or chills.  No chest pain shortness of breath.  She had light sensitivity.  She had chest x-ray that showed no active disease. She has chronic leukocytosis with WBC 16.1, and unremarkable labs. She was given a migraine cocktail, IV fluids, and reported improvement.  She  was sent home on Zofran sublingual and advised to stop smoking marijuana daily as that may cause her N/V symptoms - cannabis hyperemesis syndromes.   She was seen in the emergency room 03/29/2016 for migraine with N/V, no abdominal pain, she felt dehydrated. The ER doctor considered abdominal migraine. UA and pregnancy negative. Minimal leukocytosis, appears to be slightly chronic or seen previous on prior checks. Bilirubin just very slightly elevated, K was 3.3 and replaced. She was sent home with a Rx for  Phenergan suppository, and to use the Zofran at home.  She was seen 03/30/2016 in  the GI office with her mother. Zofran sublingual and Phenergan  helped- very minimal.  She now has a burning sensation in the epigastric area and she says vomiting does make her stomach feel better. She is able to keep liquids down today. She denies abdominal pain except when she vomits she has burning. She denies diarrhea. Her weight is down 10 pounds since Thursday. Patient was interviewed with her mother out of the room and she reports safety at home. No current boyfriend and she is not sexually active.  She was advised  Clear liquids, Carafate in addition to PPI bid, oral Zofran and liver US showed sludge in gallbladder. 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 Plan for EGD if symptoms persisted. Stop marijuana use.   She called in 04/01/2016 with persistent vomiting and unable to eat solid food. She vomited with jello yesterday despite following these directions. No BM since last week. Currently, feeling better with no further vomiting. She had  IV fluid, Protonix, Zofran, Reglan in ED, and Rocephin for UTI. She is NPO and is hungry and asking if she could have clears.    Past Medical History  Diagnosis Date  . Migraine     Past Surgical History  Procedure Laterality Date  . Knee surgery    . Esophagogastroduodenoscopy N/A 06/12/2015    Procedure: ESOPHAGOGASTRODUODENOSCOPY (EGD);  Surgeon: Scot Jun, MD;  Location: [facility] ENDOSCOPY;  Service: Endoscopy;  Laterality: N/A;    History reviewed. No pertinent family history.  Social History  Substance Use Topics  . Smoking status: Current Some Day Smoker  . Smokeless tobacco: None  . Alcohol Use: Yes    Prior to Admission medications   Medication Sig Start Date End Date Taking? Authorizing Provider  omeprazole (PRILOSEC) 40 MG capsule Take 40 mg by mouth daily.   Yes Historical Provider, MD  ondansetron (ZOFRAN ODT) 4 MG disintegrating tablet Take 1 tablet (4 mg total) by mouth every 8 (eight) hours as needed for nausea or vomiting. 03/26/16  Yes Gayla DossEryka A Gayle, MD   promethazine (PHENERGAN) 25 MG suppository Place 1 suppository (25 mg total) rectally every 6 (six) hours as needed for nausea or vomiting. 03/29/16  Yes Sharyn CreamerMark Quale, MD  sucralfate (CARAFATE) 1 GM/10ML suspension Take 1 g by mouth 4 (four) times daily -  with meals and at bedtime.   Yes Historical Provider, MD    Current Facility-Administered Medications  Medication Dose Route Frequency Provider Last Rate Last Dose  . 0.9 % NaCl with KCl 20 mEq/ L  infusion   Intravenous Continuous Katha HammingSnehalatha Konidena, MD 50 mL/hr at 04/02/16 1110    . acetaminophen (TYLENOL) tablet 650 mg  650 mg Oral Q6H PRN Adrian SaranSital Mody, MD       Or  . acetaminophen (TYLENOL) suppository 650 mg  650 mg Rectal Q6H PRN Adrian SaranSital Mody, MD      . cefTRIAXone (ROCEPHIN) 1 g in dextrose 5 % 50 mL IVPB  1 g Intravenous Q24H Katha HammingSnehalatha Konidena, MD   1 g at 04/02/16 1104  . enoxaparin (LOVENOX) injection 40 mg  40 mg Subcutaneous Q24H Adrian SaranSital Mody, MD   40 mg at 04/01/16 1857  . ondansetron (ZOFRAN) tablet 4 mg  4 mg Oral Q6H PRN Adrian SaranSital Mody, MD       Or  . ondansetron (ZOFRAN) injection 4 mg  4 mg Intravenous Q6H PRN Adrian SaranSital Mody, MD   4 mg at 04/01/16 2002  . pantoprazole (PROTONIX) injection 40 mg  40 mg Intravenous Q12H Adrian SaranSital Mody, MD   40 mg at 04/02/16 1008  . pneumococcal 23 valent vaccine (PNU-IMMUNE) injection 0.5 mL  0.5 mL Intramuscular Tomorrow-1000 Sital Mody, MD   0.5 mL at 04/02/16 1000  . promethazine (PHENERGAN) suppository 25 mg  25 mg Rectal Q6H PRN Adrian SaranSital Mody, MD      . senna-docusate (Senokot-S) tablet 1 tablet  1 tablet Oral QHS PRN Adrian SaranSital Mody, MD        Allergies as of 04/01/2016  . (No Known Allergies)     Review of Systems:    A 12 system review was obtained and all negative except where noted in HPI.    Physical Exam:  Vital signs in last 24 hours: Temp:  [97.6 F (36.4 C)-99 F (37.2 C)] 98.5 F (36.9 C) (05/05 1220) Pulse Rate:  [57-92] 57 (05/05 1220) Resp:  [12-16] 16 (05/05 1220) BP:  (118-150)/(57-95) 118/57 mmHg (05/05 1220) SpO2:  [97 %-100 %] 98 % (05/05 1220) Weight:  [89.359 kg (197 lb)] 89.359 kg (197 lb) (05/04 1553) Last BM Date: 03/26/16  General:  Well-developed, well-nourished and in no acute distress Head:  Head without obvious abnormality, atraumatic  Eyes:   Conjunctiva pink, sclera anicteric   ENT:   Mouth free of lesions, mucosa moist, tongue pink, question thrush vs not eating for white coating on tongue.  Neck:   Supple w/o thyromegaly or mass, trachea midline, no adenopathy  Lungs: Clear to auscultation bilaterally, respirations unlabored Heart:  Normal S1S2, no rubs, murmurs, gallops. Abdomen: Soft, non-tender, no hepatosplenomegaly, hernia, or mass and BS normal Rectal: Deferred Lymph:  No cervical or supraclavicular adenopathy. Extremities:   No edema, cyanosis, or clubbing Skin  Skin color, texture, turgor normal, no rashes or lesions Neuro:  A&O x 3. CNII-XII intact, normal strength Psych:  Appropriate mood and affect.  Data Reviewed:  LAB RESULTS:  Recent Labs  04/01/16 1556 04/02/16 0435  WBC 14.1* 12.1*  HGB 15.7 15.0  HCT 45.9 43.9  PLT 415 368   BMET  Recent Labs  04/01/16 1556 04/02/16 0435  NA 137 138  K 3.4* 3.8  CL 99* 103  CO2 21* 21*  GLUCOSE 85 80  BUN 11 7  CREATININE 0.80 0.73  CALCIUM 9.5 8.8*   LFT  Recent Labs  04/02/16 0435  PROT 7.2  ALBUMIN 4.2  AST 16  ALT 21  ALKPHOS 69  BILITOT 2.7*   PT/INR No results for input(s): LABPROT, INR in the last 72 hours.  STUDIES: Dg Abd 2 Views  04/01/2016  CLINICAL DATA:  Pt here with c/o vomiting x 1 week and no bowel movement; seen here last week for the same. F/U with MD today and was sent here to get some fluids. Pt reports low grade fever last night, no fever today. EXAM: ABDOMEN - 2 VIEW COMPARISON:  05/23/2015 FINDINGS: The bowel gas pattern is normal. There is no evidence of free air. No radio-opaque calculi or other significant radiographic  abnormality is seen. IMPRESSION: Negative. Electronically Signed   By: Amie Portland M.D.   On: 04/01/2016 17:43   US Abdomen Limited Ruq  04/01/2016  CLINICAL DATA:  Patient with nausea and vomiting. Hyperbilirubinemia. Right upper quadrant for 1 week. EXAM: US ABDOMEN LIMITED - RIGHT UPPER QUADRANT COMPARISON:  CT abdomen pelvis 05/28/2015. FINDINGS: Gallbladder: Probable small amount of sludge in the gallbladder lumen. No gallbladder wall thickening or pericholecystic fluid. Negative sonographic Murphy's sign. Common bile duct: Diameter: 5 mm Liver: No focal lesion identified. Within normal limits in parenchymal echogenicity. IMPRESSION: Small amount of gallbladder sludge. No sonographic evidence to suggest cholecystitis. No biliary ductal dilatation. Electronically Signed   By: Annia Belt M.D.   On: 04/01/2016 09:10     Assessment:  RIKA DAUGHDRILL is a 20 y.o. with a known history of migraine headache, reflux esophagitis  and erosive gastritis was referred to the emergency room for intractable nausea and vomiting. CO2 low 21.  Leukocytosis.  T. 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 She has positive UA with culture pending on Rocephin IV. Etiology for intractable N/V likely erosive gastritis, esophagitis. Gallbladder sludge but no cholecystitis and no abdominal pain/tenderness on exams. If EGD is unrevealing, consider HIDA. UTI may cause N/V and severe vomiting could have irritated the esophagus. Lack of BM likely from lack of solid food. No constipation or abd abnormality on Xray. She reported slight upper abd burning. No mouth burning or sore throat but evaluation for candida is needed with white tongue coating on exam today.   Plan:  Recommend EGD tomorrow morning. The risks and benefits as well as alternatives of endoscopic procedure(s) have been discussed and reviewed. All questions answered. The patient agrees to proceed. Clear liquid diet today.  Further GI recommendations pending results. Her mom and dad are in the room and plans discussed and explained to all.    This case was discussed with Dr. Scot Jun  in collaboration of care. Thank you for the consultation.  These services provided by Amedeo Kinsman RN, MSN, ANP-BC under collaborative practice agreement with Scot Jun, MD.  04/02/2016, 1:08 PM

## 2016-04-03 ENCOUNTER — Observation Stay: Payer: BC Managed Care – PPO | Admitting: Anesthesiology

## 2016-04-03 ENCOUNTER — Encounter
Admission: EM | Disposition: A | Discharge status: Home / Self Care | Payer: Self-pay | Source: Home / Self Care | Attending: Emergency Medicine

## 2016-04-03 HISTORY — PX: ESOPHAGOGASTRODUODENOSCOPY: SHX5428

## 2016-04-03 SURGERY — EGD (ESOPHAGOGASTRODUODENOSCOPY)
Anesthesia: General

## 2016-04-03 MED ORDER — LACTATED RINGERS IV SOLN
INTRAVENOUS | Status: DC | PRN
  Administered 2016-04-03: 13:00:00 via INTRAVENOUS

## 2016-04-03 MED ORDER — PROPOFOL 10 MG/ML IV BOLUS
INTRAVENOUS | Status: DC | PRN
  Administered 2016-04-03 (×2): 50 mg via INTRAVENOUS

## 2016-04-03 MED ORDER — CIPROFLOXACIN HCL 500 MG PO TABS: 500.0000 mg | ORAL_TABLET | Freq: Two times a day (BID) | ORAL | Status: DC

## 2016-04-03 MED ORDER — BUTAMBEN-TETRACAINE-BENZOCAINE 2-2-14 % EX AERO
INHALATION_SPRAY | CUTANEOUS | Status: DC | PRN
  Administered 2016-04-03: 1 via TOPICAL

## 2016-04-03 MED ORDER — PROPOFOL 500 MG/50ML IV EMUL
INTRAVENOUS | Status: DC | PRN
  Administered 2016-04-03: 120 ug/kg/min via INTRAVENOUS

## 2016-04-03 NOTE — Anesthesia Preprocedure Evaluation (Signed)
Anesthesia Evaluation  Patient identified by MRN, date of birth, ID band Patient awake    Reviewed: Allergy & Precautions, NPO status , Patient's Chart, lab work & pertinent test results  History of Anesthesia Complications Negative for: history of anesthetic complications  Airway Mallampati: II       Dental   Pulmonary neg pulmonary ROS, Current Smoker,           Cardiovascular negative cardio ROS       Neuro/Psych negative neurological ROS     GI/Hepatic negative GI ROS, Neg liver ROS,   Endo/Other  negative endocrine ROS  Renal/GU negative Renal ROS     Musculoskeletal   Abdominal   Peds  Hematology negative hematology ROS (+)   Anesthesia Other Findings   Reproductive/Obstetrics                             Anesthesia Physical Anesthesia Plan  ASA: II  Anesthesia Plan: General   Post-op Pain Management:    Induction: Intravenous  Airway Management Planned:   Additional Equipment:   Intra-op Plan:   Post-operative Plan:   Informed Consent: I have reviewed the patients History and Physical, chart, labs and discussed the procedure including the risks, benefits and alternatives for the proposed anesthesia with the patient or authorized representative who has indicated his/her understanding and acceptance.     Plan Discussed with:   Anesthesia Plan Comments:         Anesthesia Quick Evaluation

## 2016-04-03 NOTE — Transfer of Care (Signed)
Immediate Anesthesia Transfer of Care Note  Patient: Renee Carter  Procedure(s) Performed: Procedure(s): ESOPHAGOGASTRODUODENOSCOPY (EGD) (N/A)  Patient Location: PACU  Anesthesia Type:General  Level of Consciousness: sedated  Airway & Oxygen Therapy: Patient Spontanous Breathing and Patient connected to nasal cannula oxygen  Post-op Assessment: Report given to RN and Post -op Vital signs reviewed and stable  Post vital signs: Reviewed and stable  Last Vitals:  Filed Vitals:   04/03/16 0544 04/03/16 1225  BP: 115/59 134/73  Pulse: 56 61  Temp: 36.5 C 36.9 C  Resp: 18 18    Last Pain:  Filed Vitals:   04/03/16 1225  PainSc: 2          Complications: No apparent anesthesia complications

## 2016-04-03 NOTE — Progress Notes (Signed)
[facility]   PATIENT NAME: Renee Carter    MR#:  [id number]  DATE OF BIRTH:  08/17/1996  SUBJECTIVE:admitted for nausea/vomiting/poor po intake for one week. Patient has no abdominal pain. Started on IV fluids , nausea medications. Patient feels much better today. No nausea. No further vomiting. Waiting for EGD today,  CHIEF COMPLAINT:   Chief Complaint  Patient presents with  . Emesis    REVIEW OF SYSTEMS:   ROS CONSTITUTIONAL: No fever, fatigue or weakness.  EYES: No blurred or double vision.  EARS, NOSE, AND THROAT: No tinnitus or ear pain.  RESPIRATORY: No cough, shortness of breath, wheezing or hemoptysis.  CARDIOVASCULAR: No chest pain, orthopnea, edema.  GASTROINTESTINAL: No nausea, vomiting, diarrhea or abdominal pain.  GENITOURINARY: No dysuria, hematuria.  ENDOCRINE: No polyuria, nocturia,  HEMATOLOGY: No anemia, easy bruising or bleeding SKIN: No rash or lesion. MUSCULOSKELETAL: No joint pain or arthritis.   NEUROLOGIC: No tingling, numbness, weakness.  PSYCHIATRY: No anxiety or depression.   DRUG ALLERGIES:  No Known Allergies  VITALS:  Blood pressure 115/59, pulse 56, temperature 97.7 F (36.5 C), temperature source Oral, resp. rate 18, height 5\' 8"  (1.727 m), weight 89.359 kg (197 lb), last menstrual period 03/29/2016, SpO2 99 %.  PHYSICAL EXAMINATION:  GENERAL:  20 y.o.-year-old patient lying in the bed with no acute distress.  EYES: Pupils equal, round, reactive to light and accommodation. No scleral icterus. Extraocular muscles intact.  HEENT: Head atraumatic, normocephalic. Oropharynx and nasopharynx clear.  NECK:  Supple, no jugular venous distention. No thyroid enlargement, no tenderness.  LUNGS: Normal breath sounds bilaterally, no wheezing, rales,rhonchi or crepitation. No use of accessory muscles of respiration.  CARDIOVASCULAR: S1, S2 normal. No murmurs, rubs, or gallops.  ABDOMEN: Soft, nontender,  nondistended. Bowel sounds present. No organomegaly or mass.  EXTREMITIES: No pedal edema, cyanosis, or clubbing.  NEUROLOGIC: Cranial nerves II through XII are intact. Muscle strength 5/5 in all extremities. Sensation intact. Gait not checked.  PSYCHIATRIC: The patient is alert and oriented x 3.  SKIN: No obvious rash, lesion, or ulcer.    LABORATORY PANEL:   CBC  Recent Labs Lab 04/02/16 0435  WBC 12.1*  HGB 15.0  HCT 43.9  PLT 368   ------------------------------------------------------------------------------------------------------------------  Chemistries   Recent Labs Lab 04/02/16 0435  NA 138  K 3.8  CL 103  CO2 21*  GLUCOSE 80  BUN 7  CREATININE 0.73  CALCIUM 8.8*  AST 16  ALT 21  ALKPHOS 69  BILITOT 2.7*   ------------------------------------------------------------------------------------------------------------------  Cardiac Enzymes No results for input(s): TROPONINI in the last 168 hours. ------------------------------------------------------------------------------------------------------------------  RADIOLOGY:  Dg Abd 2 Views  04/01/2016  CLINICAL DATA:  Pt here with c/o vomiting x 1 week and no bowel movement; seen here last week for the same. F/U with MD today and was sent here to get some fluids. Pt reports low grade fever last night, no fever today. EXAM: ABDOMEN - 2 VIEW COMPARISON:  05/23/2015 FINDINGS: The bowel gas pattern is normal. There is no evidence of free air. No radio-opaque calculi or other significant radiographic abnormality is seen. IMPRESSION: Negative. Electronically Signed   By: Amie Portlandavid  Ormond M.D.   On: 04/01/2016 17:43   [place] Abdomen Limited Ruq  04/01/2016  CLINICAL DATA:  Patient with nausea and vomiting. Hyperbilirubinemia. Right upper quadrant for 1 week. EXAM: US ABDOMEN LIMITED - RIGHT UPPER QUADRANT COMPARISON:  CT abdomen pelvis 05/28/2015. FINDINGS: Gallbladder: Probable small amount of sludge  in the gallbladder lumen. No  gallbladder wall thickening or pericholecystic fluid. Negative sonographic Murphy's sign. Common bile duct: Diameter: 5 mm Liver: No focal lesion identified. Within normal limits in parenchymal echogenicity. IMPRESSION: Small amount of gallbladder sludge. No sonographic evidence to suggest cholecystitis. No biliary ductal dilatation. Electronically Signed   By: Annia Belt M.D.   On: 04/01/2016 09:10    EKG:  No orders found for this or any previous visit.  ASSESSMENT AND PLAN:   1. intractable nausea, vomiting possibly secondary to acute gastritis: Started on IV hydration, IV nausea medicine, IV PPIs.seen by GI,EGD today.  2 .hypokalemia secondary to nausea, vomiting: Improved with replacement #3 UTI: Patient started on Rocephin . Follow urine cultures. Sepsis present on admission with elevated count and lactic acid , WBC is coming down. Discussed with mother.. Likely discharge tomorrow if the EGD is a within normal limits  are reviewed and case discussed with Care Management/Social Workerr. Management plans discussed with the patient, family and they are in agreement.  CODE STATUS: full  TOTAL TIME TAKING CARE OF THIS PATIENT: .   POSSIBLE D/C IN 1-2DAYS, DEPENDING ON CLINICAL CONDITION.   Katha Hamming M.D on 04/03/2016 at 8:21 AM  Between 7am to 6pm - Pager - [phone]  After 6pm go to [email or web address] - password [email or web address]  [facility]  Office  [phone]  CC: Primary care physician; Pcp Not In System   Note: This dictation was prepared with Dragon dictation along with smaller phrase technology. Any transcriptional errors that result from this process are unintentional.

## 2016-04-03 NOTE — Anesthesia Postprocedure Evaluation (Signed)
Anesthesia Post Note  Patient: Renee Carter  Procedure(s) Performed: Procedure(s) (LRB): ESOPHAGOGASTRODUODENOSCOPY (EGD) (N/A)  Patient location during evaluation: PACU Anesthesia Type: General Level of consciousness: awake and alert Pain management: pain level controlled Vital Signs Assessment: post-procedure vital signs reviewed and stable Respiratory status: spontaneous breathing and respiratory function stable Cardiovascular status: stable Anesthetic complications: no    Last Vitals:  Filed Vitals:   04/03/16 1339 04/03/16 1340  BP:  142/84  Pulse:  56  Temp:    Resp: 7 7    Last Pain:  Filed Vitals:   04/03/16 1340  PainSc: 0-No pain                 KEPHART,WILLIAM K

## 2016-04-03 NOTE — Op Note (Signed)
[facility] Gastroenterology Patient Name: Renee CorporalMacy Carter Procedure Date: 04/03/2016 1:07 PM MRN: [id number] Account #: [id number] Date of Birth: 04/17/1996 Admit Type: Inpatient Age: [age] [facility] END[facility] ROOM 4 Gender: Female Note Status: Finalized Procedure:            Upper GI endoscopy Indications:          Epigastric abdominal pain, Nausea with vomiting Providers:            Scot Junobert T. Merian Wroe, MD Referring MD:         No Local Md, MD (Referring MD) Medicines:            Propofol per Anesthesia Complications:        No immediate complications. Procedure:            Pre-Anesthesia Assessment:                       - After reviewing the risks and benefits, the patient                        was deemed in satisfactory condition to undergo the                        procedure.                       After obtaining informed consent, the endoscope was                        passed under direct vision. Throughout the procedure,                        the patient's blood pressure, pulse, and oxygen                        saturations were monitored continuously. The Endoscope                        was introduced through the mouth, and advanced to the                        second part of duodenum. The upper GI endoscopy was                        accomplished without difficulty. The patient tolerated                        the procedure well. Findings:      The examined esophagus was normal. GEJ at 40cm      Localized mild-moderate inflammation characterized by erythema and       granularity in a localized area was found on the lesser curvature of the       gastric body.      The examined duodenum was normal. Impression:           - Normal esophagus.                       - Acute gastritis.                       - Normal examined duodenum.                       -  No specimens collected. Recommendation:       - The findings and recommendations were discussed with                    the patient's family. Medication for nausea and                        vomiting, medicine like Prilosec and carafate for her                        gastritis. Scot Jun, MD 04/03/2016 1:25:15 PM This report has been signed electronically. Number of Addenda: 0 Note Initiated On: 04/03/2016 1:07 PM      [facility]

## 2016-04-04 LAB — URINE CULTURE: Special Requests: NORMAL

## 2016-04-04 NOTE — Discharge Summary (Signed)
Renee HeinrichMacy A Carter, is a 20 y.o. female  DOB 03/27/1996  MRN [id number].  Admission date:  04/01/2016  Admitting Physician  Adrian SaranSital Mody, MD  Discharge Date:  04/03/2016   Primary MD  Pcp Not In System  Recommendations for primary care physician for things to follow:   Follow-up with gastroenterology as needed   Admission Diagnosis  Starvation ketoacidosis [E87.2] Nausea [R11.0] Intractable vomiting with nausea, vomiting of unspecified type [R11.10]   Discharge Diagnosis  Starvation ketoacidosis [E87.2] Nausea [R11.0] Intractable vomiting with nausea, vomiting of unspecified type [R11.10]    Active Problems:   Nausea      Past Medical History  Diagnosis Date  . Migraine     Past Surgical History  Procedure Laterality Date  . Knee surgery    . Esophagogastroduodenoscopy N/A 06/12/2015    Procedure: ESOPHAGOGASTRODUODENOSCOPY (EGD);  Surgeon: Scot Junobert T Elliott, MD;  [facility] END[facility]COPY;  Service: Endoscopy;  Laterality: N/A;       History of present illness and  Hospital Course:     Kindly see H&P for history of present illness and admission details, please review complete Labs, Consult reports and Test reports for all details in brief  HPI  from the history and physical done on the day of admission 20 year old female patient came in because of nausea, vomiting, poor by mouth intake, and epigastric abdominal pain for 1 week. Admitted for intractable nausea, vomiting.   Hospital Course  #1 intractable nausea, vomiting: Started on IV Protonix 40 mg every 12 hours, IV hydration, IV nausea medicine. Patient is seen by gastroenterology Dr. Mechele CollinElliott. And patient did have a EGD which showed acute gastritis. Advised to continue Prilosec and Carafate. Patient symptoms improved did not have further nausea and vomiting.  Discharge home with Prilosec and also Carafate 2 .hypokalemia secondary to GI losses: Aggressively replaced. #3. UTI by symptoms and urea. Discharge home with Cipro 500 mg by mouth twice a day for 5 days. Urine cultures did not show any growth.   Discharge Condition: Stable  Follow UP      Discharge Instructions  and  Discharge Medications        Medication List    STOP taking these medications        promethazine 25 MG suppository  Commonly known as:  PHENERGAN      TAKE these medications        ciprofloxacin 500 MG tablet  Commonly known as:  CIPRO  Take 1 tablet (500 mg total) by mouth 2 (two) times daily.     omeprazole 40 MG capsule  Commonly known as:  PRILOSEC  Take 40 mg by mouth daily.     ondansetron 4 MG disintegrating tablet  Commonly known as:  ZOFRAN ODT  Take 1 tablet (4 mg total) by mouth every 8 (eight) hours as needed for nausea or vomiting.     sucralfate 1 GM/10ML suspension  Commonly known as:  CARAFATE  Take 1 g by mouth 4 (four) times daily -  with meals and at bedtime.          Diet and Activity recommendation: See Discharge Instructions above   Consults obtained -  gi   Major procedures and Radiology Reports - PLEASE review detailed and final reports for all details, in brief -     Dg Chest 2 View  03/26/2016  CLINICAL DATA:  Vomiting.  Congestion.  Cough. EXAM: CHEST  2 VIEW COMPARISON:  No recent prior. FINDINGS: The heart size and  mediastinal contours are within normal limits. Both lungs are clear. The visualized skeletal structures are unremarkable. IMPRESSION: No active cardiopulmonary disease. Electronically Signed   By: Maisie Fus  Register   On: 03/26/2016 08:21   Dg Abd 2 Views  04/01/2016  CLINICAL DATA:  Pt here with c/o vomiting x 1 week and no bowel movement; seen here last week for the same. F/U with MD today and was sent here to get some fluids. Pt reports low grade fever last night, no fever today. EXAM: ABDOMEN - 2  VIEW COMPARISON:  05/23/2015 FINDINGS: The bowel gas pattern is normal. There is no evidence of free air. No radio-opaque calculi or other significant radiographic abnormality is seen. IMPRESSION: Negative. Electronically Signed   By: Amie Portland M.D.   On: 04/01/2016 17:43   US Abdomen Limited Ruq  04/01/2016  CLINICAL DATA:  Patient with nausea and vomiting. Hyperbilirubinemia. Right upper quadrant for 1 week. EXAM: US ABDOMEN LIMITED - RIGHT UPPER QUADRANT COMPARISON:  CT abdomen pelvis 05/28/2015. FINDINGS: Gallbladder: Probable small amount of sludge in the gallbladder lumen. No gallbladder wall thickening or pericholecystic fluid. Negative sonographic Murphy's sign. Common bile duct: Diameter: 5 mm Liver: No focal lesion identified. Within normal limits in parenchymal echogenicity. IMPRESSION: Small amount of gallbladder sludge. No sonographic evidence to suggest cholecystitis. No biliary ductal dilatation. Electronically Signed   By: Annia Belt M.D.   On: 04/01/2016 09:10    Micro Results    Recent Results (from the past 240 hour(s))  Urine culture     Status: Abnormal   Collection Time: 04/01/16  4:11 PM  Result Value Ref Range Status   Specimen Description URINE, RANDOM  Final   Special Requests Normal  Final   Culture 10,000 COLONIES/mL INSIGNIFICANT GROWTH (A)  Final   Report Status 04/04/2016 FINAL  Final       Today   Subjective:   Renee Carter today has no headache,no chest abdominal pain,no new weakness tingling or numbness, feels much better wants to go home today.  Objective:   Blood pressure 126/70, pulse 69, temperature 97.3 F (36.3 C), temperature source Oral, resp. rate 18, height 5\' 8"  (1.727 m), weight 89.359 kg (197 lb), last menstrual period 03/29/2016, SpO2 100 %.   Intake/Output Summary (Last 24 hours) at 04/04/16 0915 Last data filed at 04/03/16 1400  Gross per 24 hour  Intake    300 ml  Output    400 ml  Net   -100 ml    Exam Awake Alert,  Oriented x 3, No new F.N deficits, Normal affect [place].AT,PERRAL Supple Neck,No JVD, No cervical lymphadenopathy appriciated.  Symmetrical Chest wall movement, Good air movement bilaterally, CTAB RRR,No Gallops,Rubs or new Murmurs, No Parasternal Heave +ve B.Sounds, Abd Soft, Non tender, No organomegaly appriciated, No rebound -guarding or rigidity. No Cyanosis, Clubbing or edema, No new Rash or bruise  Data Review   CBC w Diff: Lab Results  Component Value Date   WBC 12.1* 04/02/2016   HGB 15.0 04/02/2016   HCT 43.9 04/02/2016   PLT 368 04/02/2016   LYMPHOPCT 25 03/26/2016   BANDSPCT 6 03/26/2016   MONOPCT 2 03/26/2016   EOSPCT 0 03/26/2016   BASOPCT 0 03/26/2016    CMP: Lab Results  Component Value Date   NA 138 04/02/2016   K 3.8 04/02/2016   CL 103 04/02/2016   CO2 21* 04/02/2016   BUN 7 04/02/2016   CREATININE 0.73 04/02/2016   PROT 7.2 04/02/2016   ALBUMIN 4.2  04/02/2016   BILITOT 2.7* 04/02/2016   ALKPHOS 69 04/02/2016   AST 16 04/02/2016   ALT 21 04/02/2016  .   Total Time in preparing paper work, data evaluation and todays exam - 35 minutes  Stark Aguinaga M.D on 04/03/2016 at 9:15 AM    Note: This dictation was prepared with Dragon dictation along with smaller phrase technology. Any transcriptional errors that result from this process are unintentional.

## 2016-06-07 ENCOUNTER — Other Ambulatory Visit: Payer: Self-pay | Admitting: Neurology

## 2016-06-07 DIAGNOSIS — G43119 Migraine with aura, intractable, without status migrainosus: Secondary | ICD-10-CM

## 2016-06-07 DIAGNOSIS — L813 Cafe au lait spots: Secondary | ICD-10-CM

## 2016-06-21 ENCOUNTER — Ambulatory Visit
Admission: RE | Admit: 2016-06-21 | Discharge: 2016-06-21 | Disposition: A | Discharge status: Home / Self Care | Payer: BC Managed Care – PPO | Source: Ambulatory Visit | Attending: Neurology | Admitting: Neurology

## 2016-06-21 DIAGNOSIS — G43119 Migraine with aura, intractable, without status migrainosus: Secondary | ICD-10-CM

## 2016-06-21 DIAGNOSIS — L813 Cafe au lait spots: Secondary | ICD-10-CM | POA: Insufficient documentation

## 2016-06-21 DIAGNOSIS — J328 Other chronic sinusitis: Secondary | ICD-10-CM | POA: Diagnosis not present

## 2016-06-21 MED ORDER — GADOBENATE DIMEGLUMINE 529 MG/ML IV SOLN
20.0000 mL | Freq: Once | INTRAVENOUS | Status: AC | PRN
  Administered 2016-06-21: 20 mL via INTRAVENOUS

## 2016-07-26 ENCOUNTER — Emergency Department
Admission: EM | Admit: 2016-07-26 | Discharge: 2016-07-26 | Disposition: A | Discharge status: Home / Self Care | Payer: BC Managed Care – PPO | Attending: Emergency Medical Services | Admitting: Emergency Medical Services

## 2016-07-26 ENCOUNTER — Emergency Department: Payer: BC Managed Care – PPO

## 2016-07-26 DIAGNOSIS — R112 Nausea with vomiting, unspecified: Secondary | ICD-10-CM | POA: Insufficient documentation

## 2016-07-26 DIAGNOSIS — E860 Dehydration: Secondary | ICD-10-CM | POA: Insufficient documentation

## 2016-07-26 LAB — CBC AND DIFFERENTIAL
Absolute NRBC: 0 10*3/uL
Basophils Absolute Automated: 0.01 10*3/uL (ref 0.00–0.20)
Basophils Automated: 0.1 %
Eosinophils Absolute Automated: 0 10*3/uL (ref 0.00–0.70)
Eosinophils Automated: 0 %
Hematocrit: 43.3 % (ref 37.0–47.0)
Hgb: 15.4 g/dL (ref 12.0–16.0)
Immature Granulocytes Absolute: 0.06 10*3/uL — ABNORMAL HIGH
Immature Granulocytes: 0.4 %
Lymphocytes Absolute Automated: 0.96 10*3/uL (ref 0.50–4.40)
Lymphocytes Automated: 6 %
MCH: 31.8 pg (ref 28.0–32.0)
MCHC: 35.6 g/dL (ref 32.0–36.0)
MCV: 89.5 fL (ref 80.0–100.0)
MPV: 10.6 fL (ref 9.4–12.3)
Monocytes Absolute Automated: 0.8 10*3/uL (ref 0.00–1.20)
Monocytes: 5 %
Neutrophils Absolute: 14.27 10*3/uL — ABNORMAL HIGH (ref 1.80–8.10)
Neutrophils: 88.5 %
Nucleated RBC: 0 /100 WBC (ref 0.0–1.0)
Platelets: 357 10*3/uL (ref 140–400)
RBC: 4.84 10*6/uL (ref 4.20–5.40)
RDW: 12 % (ref 12–15)
WBC: 16.1 10*3/uL — ABNORMAL HIGH (ref 3.50–10.80)

## 2016-07-26 LAB — COMPREHENSIVE METABOLIC PANEL
ALT: 15 U/L (ref 0–55)
AST (SGOT): 13 U/L (ref 5–34)
Albumin/Globulin Ratio: 1.3 (ref 0.9–2.2)
Albumin: 4.7 g/dL (ref 3.5–5.0)
Alkaline Phosphatase: 75 U/L (ref 37–106)
Anion Gap: 18 — ABNORMAL HIGH (ref 5.0–15.0)
BUN: 6 mg/dL — ABNORMAL LOW (ref 7–19)
Bilirubin, Total: 1.2 mg/dL (ref 0.2–1.2)
CO2: 16 mEq/L — ABNORMAL LOW (ref 22–29)
Calcium: 10.2 mg/dL (ref 8.5–10.5)
Chloride: 105 mEq/L (ref 100–111)
Creatinine: 0.9 mg/dL (ref 0.6–1.0)
Globulin: 3.6 g/dL (ref 2.0–3.6)
Glucose: 111 mg/dL — ABNORMAL HIGH (ref 70–100)
Potassium: 3.7 mEq/L (ref 3.5–5.1)
Protein, Total: 8.3 g/dL (ref 6.0–8.3)
Sodium: 139 mEq/L (ref 136–145)

## 2016-07-26 LAB — LIPASE: Lipase: 19 U/L (ref 8–78)

## 2016-07-26 LAB — GFR: EGFR: >60

## 2016-07-26 LAB — HCG, SERUM, QUALITATIVE: Hcg Qualitative: NEGATIVE

## 2016-07-26 MED ORDER — ONDANSETRON 4 MG PO TBDP
4.0000 mg | ORAL_TABLET | Freq: Three times a day (TID) | ORAL | Status: AC | PRN
Start: 2016-07-26 — End: ?

## 2016-07-26 MED ORDER — METOCLOPRAMIDE HCL 5 MG/ML IJ SOLN
10.0000 mg | Freq: Once | INTRAMUSCULAR | Status: AC
Start: 2016-07-26 — End: 2016-07-26
  Administered 2016-07-26: 10 mg via INTRAVENOUS
  Filled 2016-07-26: qty 2

## 2016-07-26 MED ORDER — SODIUM CHLORIDE 0.9 % IV BOLUS
1000.0000 mL | Freq: Once | INTRAVENOUS | Status: AC
Start: 2016-07-26 — End: 2016-07-26
  Administered 2016-07-26: 1000 mL via INTRAVENOUS

## 2016-07-26 MED ORDER — ONDANSETRON HCL 4 MG/2ML IJ SOLN
8.0000 mg | Freq: Once | INTRAMUSCULAR | Status: AC
Start: 2016-07-26 — End: 2016-07-26
  Administered 2016-07-26: 8 mg via INTRAVENOUS
  Filled 2016-07-26: qty 4

## 2016-07-26 MED ORDER — METOCLOPRAMIDE HCL 5 MG PO TABS
5.0000 mg | ORAL_TABLET | Freq: Four times a day (QID) | ORAL | Status: AC | PRN
Start: 2016-07-26 — End: ?

## 2016-07-26 NOTE — ED Provider Notes (Addendum)
Physician/Midlevel provider first contact with patient: 07/26/16 0830         History     Chief Complaint   Patient presents with   . Emesis     Patient notes Saturday morning developed nausea vomiting is not persistent including persistent nausea.  The vomiting is sometimes dark.  No gross blood.  Concerned because prior presentations with ulcer have presented similarly.  No abdominal pain no fevers or associated illness no other associates signs or symptoms no other modifiers        Past Medical History   Diagnosis Date   . PUD (peptic ulcer disease)    . Migraine        Past Surgical History   Procedure Laterality Date   . Knee surgery         No family history on file.    Social  Social History   Substance Use Topics   . Smoking status: Current Every Day Smoker   . Smokeless tobacco: None   . Alcohol Use: No       .     No Known Allergies    Home Medications     Last Medication Reconciliation Action:  In Progress Doroteo Bradford, RN 07/26/2016  8:36 AM          No Medications           Review of Systems   Constitutional: Negative for fever.   HENT: Negative for congestion.    Respiratory: Negative for shortness of breath.    Cardiovascular: Negative for chest pain.   Gastrointestinal: Positive for vomiting. Negative for abdominal pain.   Genitourinary: Negative for dysuria.   Musculoskeletal: Negative.    Skin: Negative for rash.   Neurological: Negative for headaches.   Psychiatric/Behavioral: Negative for dysphoric mood.   All other systems reviewed and are negative.      Physical Exam    BP: (!) 144/91 mmHg, Heart Rate: 96, Temp: 98.8 F (37.1 C), Resp Rate: 18, SpO2: 97 %, Weight: 88.451 kg     Physical Exam   Constitutional:   Well Appearing   HENT:   Head: Normocephalic.   Eyes: Conjunctivae are normal.   Neck:   Normal Inspection   Cardiovascular: Normal rate, regular rhythm and normal heart sounds.    Pulmonary/Chest: Breath sounds normal.   Abdominal: Soft. Bowel sounds are normal. There is no  tenderness.   Musculoskeletal:   Normal Upper Extremity Inspection   Neurological: She is alert. She has normal strength.   Skin: Skin is warm and dry.   Psychiatric: She has a normal mood and affect.         MDM and ED Course     ED Medication Orders     Start Ordered     Status Ordering Provider    07/26/16 [phone] 07/26/16 0938  metoclopramide (REGLAN) injection 10 mg   Once     Route: Intravenous  Ordered Dose: 10 mg     Last [facility] action:  Given Chase Picket    07/26/16 0843 07/26/16 0842  sodium chloride 0.9 % bolus 1,000 mL   Once     Route: Intravenous  Ordered Dose: 1,000 mL     Last [facility] action:  Juanna Cao J    07/26/16 0843 07/26/16 0842  ondansetron (ZOFRAN) injection 8 mg   Once     Route: Intravenous  Ordered Dose: 8 mg     Last [facility] action:  Given  Conroy Goracke J             MDM   She has tolerated liquids here.  I reexamined her abdomen which was benign.  She had issues with vomiting in the past.  She has no anemia.  There is a leukocytosis but also a decreased CO2 which is more suggestive of hemoconcentration and dehydration than of an acute serious abdominal process.  She has specifically no epigastric or right upper quadrant guarding to suggest need for emergent ultrasound or other imaging.  I recommend she return home to see her gastro            Procedures    Clinical Impression & Disposition     Clinical Impression  Final diagnoses:   Dehydration   Non-intractable vomiting with nausea, unspecified vomiting type        ED Disposition     Discharge Isaac Laud discharge to home/self care.    Condition at disposition: Stable             New Prescriptions    METOCLOPRAMIDE (REGLAN) 5 MG TABLET    Take 1 tablet (5 mg total) by mouth 4 (four) times daily as needed (nausea).    ONDANSETRON (ZOFRAN ODT) 4 MG DISINTEGRATING TABLET    Take 1 tablet (4 mg total) by mouth every 8 (eight) hours as needed for Nausea.                   Chase Picket, MD  07/26/16 1610    Chase Picket, MD  07/26/16 1058

## 2016-07-26 NOTE — ED Notes (Signed)
Vomiting since Saturday, 10 episodes last pm.  abd pain when vomits.  Denies pregnancy

## 2016-07-31 ENCOUNTER — Encounter: Payer: Self-pay | Admitting: Emergency Medicine

## 2016-07-31 ENCOUNTER — Emergency Department
Admission: EM | Admit: 2016-07-31 | Discharge: 2016-07-31 | Disposition: A | Discharge status: Home / Self Care | Payer: BC Managed Care – PPO | Attending: Emergency Medicine | Admitting: Emergency Medicine

## 2016-07-31 DIAGNOSIS — Z79899 Other long term (current) drug therapy: Secondary | ICD-10-CM | POA: Diagnosis not present

## 2016-07-31 DIAGNOSIS — F17200 Nicotine dependence, unspecified, uncomplicated: Secondary | ICD-10-CM | POA: Diagnosis not present

## 2016-07-31 DIAGNOSIS — R1115 Cyclical vomiting syndrome unrelated to migraine: Secondary | ICD-10-CM

## 2016-07-31 DIAGNOSIS — K2970 Gastritis, unspecified, without bleeding: Secondary | ICD-10-CM | POA: Diagnosis not present

## 2016-07-31 DIAGNOSIS — G43A0 Cyclical vomiting, not intractable: Secondary | ICD-10-CM | POA: Diagnosis present

## 2016-07-31 LAB — COMPREHENSIVE METABOLIC PANEL
ALK PHOS: 53 U/L (ref 38–126)
ALT: 15 U/L (ref 14–54)
ANION GAP: 12 (ref 5–15)
AST: 14 U/L — ABNORMAL LOW (ref 15–41)
Albumin: 4.3 g/dL (ref 3.5–5.0)
BILIRUBIN TOTAL: 2.1 mg/dL — AB (ref 0.3–1.2)
BUN: 10 mg/dL (ref 6–20)
CALCIUM: 8.9 mg/dL (ref 8.9–10.3)
CO2: 27 mmol/L (ref 22–32)
Chloride: 99 mmol/L — ABNORMAL LOW (ref 101–111)
Creatinine, Ser: 0.85 mg/dL (ref 0.44–1.00)
GFR calc non Af Amer: >60 mL/min (ref >60)
Glucose, Bld: 93 mg/dL (ref 65–99)
Potassium: 2.7 mmol/L — CL (ref 3.5–5.1)
Sodium: 138 mmol/L (ref 135–145)
TOTAL PROTEIN: 6.9 g/dL (ref 6.5–8.1)

## 2016-07-31 LAB — CBC
HCT: 48.2 % — ABNORMAL HIGH (ref 35.0–47.0)
HEMOGLOBIN: 17.5 g/dL — AB (ref 12.0–16.0)
MCH: 32.2 pg (ref 26.0–34.0)
MCHC: 36.3 g/dL — ABNORMAL HIGH (ref 32.0–36.0)
MCV: 88.9 fL (ref 80.0–100.0)
Platelets: 290 10*3/uL (ref 150–440)
RBC: 5.43 MIL/uL — AB (ref 3.80–5.20)
RDW: 12.6 % (ref 11.5–14.5)
WBC: 11.7 10*3/uL — ABNORMAL HIGH (ref 3.6–11.0)

## 2016-07-31 LAB — POCT PREGNANCY, URINE: Preg Test, Ur: NEGATIVE

## 2016-07-31 LAB — BETA-HYDROXYBUTYRIC ACID: Beta-Hydroxybutyric Acid: 3.38 mmol/L — ABNORMAL HIGH (ref 0.05–0.27)

## 2016-07-31 LAB — URINALYSIS COMPLETE WITH MICROSCOPIC (ARMC ONLY)
BILIRUBIN URINE: NEGATIVE
Glucose, UA: NEGATIVE mg/dL
Leukocytes, UA: NEGATIVE
NITRITE: NEGATIVE
PH: 7 (ref 5.0–8.0)
Protein, ur: NEGATIVE mg/dL
Specific Gravity, Urine: 1.011 (ref 1.005–1.030)

## 2016-07-31 LAB — LIPASE, BLOOD: Lipase: 34 U/L (ref 11–51)

## 2016-07-31 MED ORDER — SODIUM CHLORIDE 0.9 % IV BOLUS (SEPSIS)
1000.0000 mL | Freq: Once | INTRAVENOUS | Status: AC
  Administered 2016-07-31: 1000 mL via INTRAVENOUS

## 2016-07-31 MED ORDER — POTASSIUM CHLORIDE 10 MEQ/100ML IV SOLN
10.0000 meq | Freq: Once | INTRAVENOUS | Status: AC
  Administered 2016-07-31: 10 meq via INTRAVENOUS
  Filled 2016-07-31: qty 100

## 2016-07-31 MED ORDER — METOCLOPRAMIDE HCL 5 MG/ML IJ SOLN
10.0000 mg | Freq: Once | INTRAMUSCULAR | Status: AC
  Administered 2016-07-31: 10 mg via INTRAVENOUS
  Filled 2016-07-31: qty 2

## 2016-07-31 MED ORDER — ONDANSETRON HCL 4 MG/2ML IJ SOLN
4.0000 mg | Freq: Once | INTRAMUSCULAR | Status: AC
  Administered 2016-07-31: 4 mg via INTRAVENOUS
  Filled 2016-07-31: qty 2

## 2016-07-31 MED ORDER — POTASSIUM CHLORIDE 20 MEQ/15ML (10%) PO SOLN
20.0000 meq | Freq: Once | ORAL | Status: AC
  Administered 2016-07-31: 20 meq via ORAL
  Filled 2016-07-31: qty 15

## 2016-07-31 NOTE — ED Provider Notes (Signed)
[facility] Emergency Department Provider Note    ____________________________________________   First MD Initiated Contact with Patient 07/31/16 [phone]     (approximate)  I have reviewed the triage vital signs and the nursing notes.   HISTORY  Chief Complaint Emesis    HPI Renee Carter is a 20 y.o. female presents for evaluation of nausea and vomiting.  Patient reports that she has recurrent vomiting, and that about a week ago she began experiencing nausea with intractable vomiting despite use of Zofran, Reglan, and Phenergan.  She was in [place] for work, was seen in [place] at the [place] and hydrated and discharged, then traveling home had to stop at [place] where she was hydrated, provided nausea medicine and again discharged on Tuesday.  She reports she continues to feel ongoing nausea, she'll vomiting or from 2 this 6 times a day and has not had anything including no water or fluids for the last few days. She feels "weak and tired".  Denies pregnancy and is currently on her period. Denies any abdominal pain, reports these symptoms have occurred before and diagnosed as "gastritis" under endoscopy with Dr. Vira Agar.  Of note I have met this patient before, she is very pleasant, and her presentation today seems very similar to that of when I saw her and she was admitted for similar.   Past Medical History:  Diagnosis Date  . Migraine   Starvation ketosis Gastritis  Patient Active Problem List   Diagnosis Date Noted  . Nausea 04/01/2016  . Migraine     Past Surgical History:  Procedure Laterality Date  . ESOPHAGOGASTRODUODENOSCOPY N/A 06/12/2015   Procedure: ESOPHAGOGASTRODUODENOSCOPY (EGD);  Surgeon: Manya Silvas, MD;  Location: [facility] ENDOSCOPY;  Service: Endoscopy;  Laterality: N/A;  . KNEE SURGERY      Prior to Admission medications   Medication Sig Start Date End Date Taking? Authorizing Provider  metoCLOPramide (REGLAN) 5 MG  tablet Take 5 mg by mouth 4 (four) times daily as needed for nausea. 07/26/16  Yes Historical Provider, MD  omeprazole (PRILOSEC) 40 MG capsule Take 40 mg by mouth 2 (two) times daily.    Yes Historical Provider, MD  ondansetron (ZOFRAN) 4 MG tablet Take 4 mg by mouth every 8 (eight) hours as needed for nausea. 07/24/16  Yes Historical Provider, MD  promethazine (PHENERGAN) 25 MG suppository Place 25 mg rectally every 8 (eight) hours as needed for nausea or vomiting.   Yes Historical Provider, MD  SUMAtriptan (IMITREX) 100 MG tablet Take 100 mg by mouth See admin instructions. Take 1 tablet by mouth as needed for migraine for up to 1 dose, may take 2nd dose after 2 hours if needed. 05/24/16  Yes Historical Provider, MD  topiramate (TOPAMAX) 50 MG tablet Take 50 mg by mouth at bedtime. 07/21/16  Yes Historical Provider, MD  ciprofloxacin (CIPRO) 500 MG tablet Take 1 tablet (500 mg total) by mouth 2 (two) times daily. Patient not taking: Reported on 07/31/2016 04/03/16   Epifanio Lesches, MD  ondansetron (ZOFRAN ODT) 4 MG disintegrating tablet Take 1 tablet (4 mg total) by mouth every 8 (eight) hours as needed for nausea or vomiting. Patient not taking: Reported on 07/31/2016 03/26/16   Joanne Gavel, MD   Last use a Phenergan suppository at 4 AM  Allergies Review of patient's allergies indicates no known allergies.  No family history on file.  Social History Social History  Substance Use Topics  . Smoking status: Current Some Day Smoker  .  Smokeless tobacco: Never Used  . Alcohol use Yes    Review of Systems Constitutional: No fever/chills Eyes: No visual changes. ENT: No sore throat. Cardiovascular: Denies chest pain. Respiratory: Denies shortness of breath. Gastrointestinal: No abdominal pain.  No diarrhea.  No constipation. Genitourinary: Negative for dysuria. Musculoskeletal: Negative for back pain. Skin: Negative for rash. Neurological: Negative for headaches, focal weakness or  numbness.  10-point ROS otherwise negative.  ____________________________________________   PHYSICAL EXAM:  VITAL SIGNS: ED Triage Vitals  Enc Vitals Group     BP 07/31/16 0836 (!) 143/92     Pulse Rate 07/31/16 0836 85     Resp 07/31/16 0836 18     Temp 07/31/16 0836 98.2 F (36.8 C)     Temp Source 07/31/16 0836 Oral     SpO2 07/31/16 0836 99 %     Weight 07/31/16 0837 190 lb (86.2 kg)     Height 07/31/16 0837 5' 8"  (1.727 m)     Head Circumference --      Peak Flow --      Pain Score 07/31/16 0837 0     Pain Loc --      Pain Edu? --      Excl. in [place]? --     Constitutional: Alert and oriented. Well appearing and in no acute distress. Eyes: Conjunctivae are normal. PERRL. EOMI. Head: Atraumatic. Nose: No congestion/rhinnorhea. Mouth/Throat: Mucous membranes are Moderately dry.  Oropharynx non-erythematous. Neck: No stridor.   Cardiovascular: Normal rate, regular rhythm. Grossly normal heart sounds.  Good peripheral circulation. Respiratory: Normal respiratory effort.  No retractions. Lungs CTAB. Gastrointestinal: Soft and nontender. No distention. No abdominal bruits. No CVA tenderness. Musculoskeletal: No lower extremity tenderness nor edema.  No joint effusions. Neurologic:  Normal speech and language. No gross focal neurologic deficits are appreciated. Skin:  Skin is warm, dry and intact. No rash noted. Slight increase in skin turgor Psychiatric: Mood and affect are normal. Speech and behavior are normal.  ____________________________________________   LABS (all labs ordered are listed, but only abnormal results are displayed)  Labs Reviewed  CBC - Abnormal; Notable for the following:       Result Value   WBC 11.7 (*)    RBC 5.43 (*)    Hemoglobin 17.5 (*)    HCT 48.2 (*)    MCHC 36.3 (*)    All other components within normal limits  URINALYSIS COMPLETEWITH MICROSCOPIC ([facility] ONLY) - Abnormal; Notable for the following:    Color, Urine YELLOW (*)     APPearance CLOUDY (*)    Ketones, ur 2+ (*)    Hgb urine dipstick 1+ (*)    Bacteria, UA RARE (*)    Squamous Epithelial / LPF 0-5 (*)    All other components within normal limits  BETA-HYDROXYBUTYRIC ACID - Abnormal; Notable for the following:    Beta-Hydroxybutyric Acid 3.38 (*)    All other components within normal limits  COMPREHENSIVE METABOLIC PANEL - Abnormal; Notable for the following:    Potassium 2.7 (*)    Chloride 99 (*)    AST 14 (*)    Total Bilirubin 2.1 (*)    All other components within normal limits  LIPASE, BLOOD  POC URINE PREG, ED  POCT PREGNANCY, URINE   ____________________________________________  EKG Performed for QT evaluation  Reviewed and interpreted by me at 9:45 AM Heart rate 65 Care is 100 QTc 430, notably normal PR 115 Normal sinus rhythm, no evidence of ischemic abnormality ____________________________________________  RADIOLOGY  ____________________________________________   PROCEDURES  Procedure(s) performed: None  Procedures  Critical Care performed: No  ____________________________________________   INITIAL IMPRESSION / ASSESSMENT AND PLAN / ED COURSE  Pertinent labs & imaging results that were available during my care of the patient were reviewed by me and considered in my medical decision making (see chart for details).  Patient presents for ongoing nausea and vomiting despite treatment with multiple antiemetics, and 2 previous ER visits this week. Reports same as previous admission, and carries a history of known gastritis on endoscopy followed by Dr. Vira Agar.  Very reassuring abdominal exam, stable hemodynamics, no evidence of acute instability. I will hydrate her, provide antiemetics after checking a QT, and a monitor her closely while we await lab tests and hydration.  Clinical Course   ----------------------------------------- 11:53 AM on 07/31/2016 -----------------------------------------  The patient is  resting, reports her nausea has improved and she would like to try taking something by mouth. I have also ordered repletion of potassium in additional fluids, as she does appear to have ketones again present and hypokalemia. Likely secondary to vomiting and inability to keep food down. Discussed with patient and her mom, she reports that she is feeling quite a bit better and if she is able to keep food on the stomach she would be comfortable going home with ongoing antiemetics and close GI follow-up. I think this is reasonable, and we will monitor for improvement and ability to take by mouth before any decision to discharge.  ----------------------------------------- 1:18 PM on 07/31/2016 -----------------------------------------  Patient has been able tolerate about 20 mL of Gatorade, water, and potassium repletion. She reports all symptoms much better, and she feels that she would like to go home. I think this is agreeable. Careful return precautions discussed. She did have evidence of starvation ketosis, however now that she is tolerating by mouth well, well-hydrated, I suspect that she should be able to do fine at home with careful precautions. Reports that she has prescriptions for Phenergan, Zofran, and Reglan already at home. Follow closely with Dr. Vira Agar in gastroenterology. Mother driving her home  ____________________________________________   FINAL CLINICAL IMPRESSION(S) / ED DIAGNOSES  Final diagnoses:  Gastritis  Non-intractable cyclical vomiting with nausea      NEW MEDICATIONS STARTED DURING THIS VISIT:  New Prescriptions   No medications on file     Note:  This document was prepared using Dragon voice recognition software and may include unintentional dictation errors.     Delman Kitten, MD 07/31/16 1318

## 2016-07-31 NOTE — ED Notes (Signed)
Pt alert and oriented X4, active, cooperative, pt in NAD. RR even and unlabored, color WNL.  Pt informed to return if any life threatening symptoms occur.  Pt tolerated PO fluids well.

## 2016-07-31 NOTE — Discharge Instructions (Signed)
You were seen in the emergency room for vomiting and dehydration. Follow-up with Dr. Markham JordanElliot on Monday via phone and setup close follow-up.  Please return to the emergency room right away if you are to develop a fever, severe nausea, your pain becomes severe or worsens, you are unable to keep food down, begin vomiting any dark or bloody fluid, you develop any dark or bloody stools, feel dehydrated, or other new concerns or symptoms arise.

## 2016-07-31 NOTE — ED Triage Notes (Signed)
Reports vomiting x 1 wk.  States she has episodes of vomiting like this but usually accompanied with a migraine but no headache this time.

## 2016-07-31 NOTE — ED Notes (Signed)
Pt drank her medication with the last of her gatorade. Given water and apple juice.

## 2016-08-03 ENCOUNTER — Other Ambulatory Visit: Payer: Self-pay | Admitting: Nurse Practitioner

## 2016-08-03 DIAGNOSIS — R110 Nausea: Secondary | ICD-10-CM

## 2016-08-03 DIAGNOSIS — R1110 Vomiting, unspecified: Secondary | ICD-10-CM

## 2016-08-05 ENCOUNTER — Observation Stay
Admission: EM | Admit: 2016-08-05 | Discharge: 2016-08-07 | Disposition: A | Discharge status: Home / Self Care | Payer: BC Managed Care – PPO | Attending: Internal Medicine | Admitting: Internal Medicine

## 2016-08-05 ENCOUNTER — Encounter: Payer: Self-pay | Admitting: Emergency Medicine

## 2016-08-05 DIAGNOSIS — F1290 Cannabis use, unspecified, uncomplicated: Secondary | ICD-10-CM | POA: Insufficient documentation

## 2016-08-05 DIAGNOSIS — G43909 Migraine, unspecified, not intractable, without status migrainosus: Secondary | ICD-10-CM | POA: Diagnosis not present

## 2016-08-05 DIAGNOSIS — K2970 Gastritis, unspecified, without bleeding: Secondary | ICD-10-CM | POA: Diagnosis not present

## 2016-08-05 DIAGNOSIS — Z9889 Other specified postprocedural states: Secondary | ICD-10-CM | POA: Insufficient documentation

## 2016-08-05 DIAGNOSIS — G43A1 Cyclical vomiting, intractable: Secondary | ICD-10-CM | POA: Diagnosis present

## 2016-08-05 DIAGNOSIS — R1115 Cyclical vomiting syndrome unrelated to migraine: Secondary | ICD-10-CM

## 2016-08-05 DIAGNOSIS — Z79899 Other long term (current) drug therapy: Secondary | ICD-10-CM | POA: Insufficient documentation

## 2016-08-05 DIAGNOSIS — F17210 Nicotine dependence, cigarettes, uncomplicated: Secondary | ICD-10-CM | POA: Diagnosis not present

## 2016-08-05 DIAGNOSIS — G43A0 Cyclical vomiting, not intractable: Secondary | ICD-10-CM | POA: Diagnosis present

## 2016-08-05 DIAGNOSIS — Z8249 Family history of ischemic heart disease and other diseases of the circulatory system: Secondary | ICD-10-CM | POA: Diagnosis not present

## 2016-08-05 DIAGNOSIS — Z820 Family history of epilepsy and other diseases of the nervous system: Secondary | ICD-10-CM | POA: Diagnosis not present

## 2016-08-05 DIAGNOSIS — R112 Nausea with vomiting, unspecified: Secondary | ICD-10-CM | POA: Diagnosis present

## 2016-08-05 HISTORY — DX: Cyclical vomiting syndrome unrelated to migraine: R11.15

## 2016-08-05 LAB — COMPREHENSIVE METABOLIC PANEL
ALK PHOS: 61 U/L (ref 38–126)
ALT: 23 U/L (ref 14–54)
ANION GAP: 12 (ref 5–15)
AST: 21 U/L (ref 15–41)
Albumin: 5 g/dL (ref 3.5–5.0)
BILIRUBIN TOTAL: 1.1 mg/dL (ref 0.3–1.2)
BUN: 5 mg/dL — ABNORMAL LOW (ref 6–20)
CALCIUM: 9.9 mg/dL (ref 8.9–10.3)
CO2: 24 mmol/L (ref 22–32)
CREATININE: 0.83 mg/dL (ref 0.44–1.00)
Chloride: 102 mmol/L (ref 101–111)
Glucose, Bld: 135 mg/dL — ABNORMAL HIGH (ref 65–99)
Potassium: 3.4 mmol/L — ABNORMAL LOW (ref 3.5–5.1)
SODIUM: 138 mmol/L (ref 135–145)
TOTAL PROTEIN: 8 g/dL (ref 6.5–8.1)

## 2016-08-05 LAB — CBC
HCT: 47.2 % — ABNORMAL HIGH (ref 35.0–47.0)
HEMOGLOBIN: 16.6 g/dL — AB (ref 12.0–16.0)
MCH: 32 pg (ref 26.0–34.0)
MCHC: 35.2 g/dL (ref 32.0–36.0)
MCV: 90.9 fL (ref 80.0–100.0)
PLATELETS: 331 10*3/uL (ref 150–440)
RBC: 5.19 MIL/uL (ref 3.80–5.20)
RDW: 11.9 % (ref 11.5–14.5)
WBC: 9 10*3/uL (ref 3.6–11.0)

## 2016-08-05 MED ORDER — ONDANSETRON HCL 4 MG/2ML IJ SOLN
4.0000 mg | Freq: Once | INTRAMUSCULAR | Status: AC | PRN
  Administered 2016-08-05: 4 mg via INTRAVENOUS
  Filled 2016-08-05: qty 2

## 2016-08-05 MED ORDER — LORAZEPAM 2 MG/ML IJ SOLN
1.0000 mg | Freq: Once | INTRAMUSCULAR | Status: AC
  Administered 2016-08-05: 1 mg via INTRAVENOUS
  Filled 2016-08-05: qty 1

## 2016-08-05 MED ORDER — ONDANSETRON HCL 4 MG/2ML IJ SOLN
4.0000 mg | Freq: Once | INTRAMUSCULAR | Status: AC
  Administered 2016-08-05: 4 mg via INTRAVENOUS
  Filled 2016-08-05: qty 2

## 2016-08-05 MED ORDER — SODIUM CHLORIDE 0.9 % IV SOLN
Freq: Once | INTRAVENOUS | Status: AC
  Administered 2016-08-05: 21:00:00 via INTRAVENOUS

## 2016-08-05 MED ORDER — PROMETHAZINE HCL 25 MG/ML IJ SOLN
25.0000 mg | Freq: Once | INTRAMUSCULAR | Status: AC
  Administered 2016-08-05: 25 mg via INTRAVENOUS
  Filled 2016-08-05: qty 1

## 2016-08-05 NOTE — H&P (Signed)
[facility] - [place] at [facility]   PATIENT NAME: Unknown Flannigan    MR#:  [id number]  DATE OF BIRTH:  1996/11/04  DATE OF ADMISSION:  08/05/2016  PRIMARY CARE PHYSICIAN: No PCP Per Patient   REQUESTING/REFERRING PHYSICIAN: Mayford Knife, MD  CHIEF COMPLAINT:   Chief Complaint  Patient presents with  . Emesis    HISTORY OF PRESENT ILLNESS:  Halsey Persaud  is a 20 y.o. female who presents with Intractable nausea and vomiting. Patient has been seeing gastroenterology for cyclic vomiting for the past several months. Is a history of migraines, and initially her uncontrollable vomiting episodes would happen in conjunction migraines. However, recently she has been having these vomiting episodes without migraines. She had endoscopy done by Dr. Mechele Collin and was placed on a PPI. She was recently scheduled for outpatient HIDA scan which has not yet been done. She comes in the hospital tonight for another episode of intractable nausea and vomiting.  PAST MEDICAL HISTORY:   Past Medical History:  Diagnosis Date  . Cyclical vomiting   . Migraine   . Migraine     PAST SURGICAL HISTORY:   Past Surgical History:  Procedure Laterality Date  . ANTERIOR CRUCIATE LIGAMENT REPAIR    . ESOPHAGOGASTRODUODENOSCOPY N/A 06/12/2015   Procedure: ESOPHAGOGASTRODUODENOSCOPY (EGD);  Surgeon: Scot Jun, MD;  Location: [facility] ENDOSCOPY;  Service: Endoscopy;  Laterality: N/A;  . KNEE SURGERY      SOCIAL HISTORY:   Social History  Substance Use Topics  . Smoking status: Current Some Day Smoker    Types: Cigarettes  . Smokeless tobacco: Never Used  . Alcohol use Yes    FAMILY HISTORY:   Family History  Problem Relation Age of Onset  . Gallbladder disease Mother   . Hypertension Mother   . Migraines Mother     DRUG ALLERGIES:  No Known Allergies  MEDICATIONS AT HOME:   Prior to Admission medications   Medication Sig Start Date End Date Taking? Authorizing Provider   metoCLOPramide (REGLAN) 5 MG tablet Take 5 mg by mouth 4 (four) times daily as needed for nausea. 07/26/16  Yes Historical Provider, MD  omeprazole (PRILOSEC) 40 MG capsule Take 40 mg by mouth 2 (two) times daily.    Yes Historical Provider, MD  ondansetron (ZOFRAN) 4 MG tablet Take 4 mg by mouth every 8 (eight) hours as needed for nausea. 07/24/16  Yes Historical Provider, MD  promethazine (PHENERGAN) 25 MG suppository Place 25 mg rectally every 8 (eight) hours as needed for nausea or vomiting.   Yes Historical Provider, MD  SUMAtriptan (IMITREX) 100 MG tablet Take 100 mg by mouth See admin instructions. Take 1 tablet by mouth as needed for migraine for up to 1 dose, may take 2nd dose after 2 hours if needed. 05/24/16  Yes Historical Provider, MD  topiramate (TOPAMAX) 50 MG tablet Take 50 mg by mouth at bedtime. 07/21/16  Yes Historical Provider, MD  ciprofloxacin (CIPRO) 500 MG tablet Take 1 tablet (500 mg total) by mouth 2 (two) times daily. Patient not taking: Reported on 07/31/2016 04/03/16   Katha Hamming, MD  ondansetron (ZOFRAN ODT) 4 MG disintegrating tablet Take 1 tablet (4 mg total) by mouth every 8 (eight) hours as needed for nausea or vomiting. Patient not taking: Reported on 07/31/2016 03/26/16   Gayla Doss, MD    REVIEW OF SYSTEMS:  Review of Systems  Constitutional: Negative for chills, fever, malaise/fatigue and weight loss.  HENT: Negative for ear pain, hearing  loss and tinnitus.   Eyes: Negative for blurred vision, double vision, pain and redness.  Respiratory: Negative for cough, hemoptysis and shortness of breath.   Cardiovascular: Negative for chest pain, palpitations, orthopnea and leg swelling.  Gastrointestinal: Positive for abdominal pain, nausea and vomiting. Negative for constipation and diarrhea.  Genitourinary: Negative for dysuria, frequency and hematuria.  Musculoskeletal: Negative for back pain, joint pain and neck pain.  Skin:       No acne, rash, or lesions   Neurological: Negative for dizziness, tremors, focal weakness and weakness.  Endo/Heme/Allergies: Negative for polydipsia. Does not bruise/bleed easily.  Psychiatric/Behavioral: Negative for depression. The patient is not nervous/anxious and does not have insomnia.      VITAL SIGNS:   Vitals:   08/05/16 1848 08/05/16 2113  BP: (!) 156/106 (!) 151/102  Pulse: 99 (!) 111  Resp: 18 18  Temp: 98.6 F (37 C)   TempSrc: Oral   SpO2: 99% 98%  Weight: 82.6 kg (182 lb)   Height: 5\' 8"  (1.727 m)    Wt Readings from Last 3 Encounters:  08/05/16 82.6 kg (182 lb)  07/31/16 86.2 kg (190 lb)  06/21/16 98.9 kg (218 lb)    PHYSICAL EXAMINATION:  Physical Exam  Vitals reviewed. Constitutional: She is oriented to person, place, and time. She appears well-developed and well-nourished. No distress.  HENT:  Head: Normocephalic and atraumatic.  Dry mucous membranes  Eyes: Conjunctivae and EOM are normal. Pupils are equal, round, and reactive to light. No scleral icterus.  Neck: Normal range of motion. Neck supple. No JVD present. No thyromegaly present.  Cardiovascular: Normal rate, regular rhythm and intact distal pulses.  Exam reveals no gallop and no friction rub.   No murmur heard. Respiratory: Effort normal and breath sounds normal. No respiratory distress. She has no wheezes. She has no rales.  GI: Soft. Bowel sounds are normal. She exhibits no distension. There is no tenderness.  Musculoskeletal: Normal range of motion. She exhibits no edema.  No arthritis, no gout  Lymphadenopathy:    She has no cervical adenopathy.  Neurological: She is alert and oriented to person, place, and time. No cranial nerve deficit.  No dysarthria, no aphasia  Skin: Skin is warm and dry. No rash noted. No erythema.  Psychiatric: She has a normal mood and affect. Her behavior is normal. Judgment and thought content normal.    LABORATORY PANEL:   CBC  Recent Labs Lab 08/05/16 1900  WBC 9.0  HGB  16.6*  HCT 47.2*  PLT 331   ------------------------------------------------------------------------------------------------------------------  Chemistries   Recent Labs Lab 08/05/16 1900  NA 138  K 3.4*  CL 102  CO2 24  GLUCOSE 135*  BUN 5*  CREATININE 0.83  CALCIUM 9.9  AST 21  ALT 23  ALKPHOS 61  BILITOT 1.1   ------------------------------------------------------------------------------------------------------------------  Cardiac Enzymes No results for input(s): TROPONINI in the last 168 hours. ------------------------------------------------------------------------------------------------------------------  RADIOLOGY:  No results found.  EKG:   Orders placed or performed during the hospital encounter of 07/31/16  . ED EKG  . ED EKG  . EKG 12-Lead  . EKG 12-Lead    IMPRESSION AND PLAN:  Principal Problem:   Intractable nausea and vomiting - when necessary antiemetics, IV fluids for hydration, HIDA scan ordered, GI consult.  Question possibility of abdominal migraines? Active Problems:   Migraine - continue home prophylactic medication.   Gastritis - given this diagnosis after EGD, placed on PPI, continue the same while here  All the records  are reviewed and case discussed with ED provider. Management plans discussed with the patient and/or family.  DVT PROPHYLAXIS: SubQ lovenox  GI PROPHYLAXIS: PPI  ADMISSION STATUS: Observation  CODE STATUS: Full Code Status History    Date Active Date Inactive Code Status Order ID Comments User Context   04/01/2016  6:46 PM 04/03/2016  8:17 PM Full Code [id number]  Adrian Saran, MD Inpatient      TOTAL TIME TAKING CARE OF THIS PATIENT: 40 minutes.    Ermina Oberman FIELDING 08/05/2016, 11:20 PM  [facility]  Office  [phone]  CC: Primary care physician; No PCP Per Patient

## 2016-08-05 NOTE — ED Provider Notes (Signed)
[facility] Emergency Department Provider Note        Time seen: ----------------------------------------- 9:15 PM on 08/05/2016 -----------------------------------------    I have reviewed the triage vital signs and the nursing notes.   HISTORY  Chief Complaint Emesis    HPI Renee Carter is a 20 y.o. female who presents to ER with cyclical vomiting for the last 2 weeks. Patient has been vomiting yellow emesis in triage. Patient was seen in the ER on Saturday and follow-up with GI on Monday and diagnosed with gastritis. Patient reports no vomiting Tuesday or Wednesday with an lunch began vomiting again. She vomited 7 times since noon. Occasionally smokes marijuana but hasn't in 2 weeks. Mother reports blood in the emesis occasionally. She's had numerous hospital visits for similar.   Past Medical History:  Diagnosis Date  . Cyclical vomiting   . Migraine   . Migraine     Patient Active Problem List   Diagnosis Date Noted  . Nausea 04/01/2016  . Migraine     Past Surgical History:  Procedure Laterality Date  . ANTERIOR CRUCIATE LIGAMENT REPAIR    . ESOPHAGOGASTRODUODENOSCOPY N/A 06/12/2015   Procedure: ESOPHAGOGASTRODUODENOSCOPY (EGD);  Surgeon: Scot Junobert T Elliott, MD;  Location: [facility] ENDOSCOPY;  Service: Endoscopy;  Laterality: N/A;  . KNEE SURGERY      Allergies Review of patient's allergies indicates no known allergies.  Social History Social History  Substance Use Topics  . Smoking status: Current Some Day Smoker    Types: Cigarettes  . Smokeless tobacco: Never Used  . Alcohol use Yes    Review of Systems Constitutional: Negative for fever. Cardiovascular: Negative for chest pain. Respiratory: Negative for shortness of breath. Gastrointestinal: Positive for abdominal pain, vomiting Genitourinary: Negative for dysuria. Musculoskeletal: Negative for back pain. Skin: Negative for rash. Neurological: Negative for headaches, focal  weakness or numbness.  10-point ROS otherwise negative.  ____________________________________________   PHYSICAL EXAM:  VITAL SIGNS: ED Triage Vitals  Enc Vitals Group     BP 08/05/16 1848 (!) 156/106     Pulse Rate 08/05/16 1848 99     Resp 08/05/16 1848 18     Temp 08/05/16 1848 98.6 F (37 C)     Temp Source 08/05/16 1848 Oral     SpO2 08/05/16 1848 99 %     Weight 08/05/16 1848 182 lb (82.6 kg)     Height 08/05/16 1848 5\' 8"  (1.727 m)     Head Circumference --      Peak Flow --      Pain Score 08/05/16 1849 0     Pain Loc --      Pain Edu? --      Excl. in GC? --     Constitutional: Alert and oriented. Mild distress, actively vomiting Eyes: Conjunctivae are normal. PERRL. Normal extraocular movements. ENT   Head: Normocephalic and atraumatic.   Nose: No congestion/rhinnorhea.   Mouth/Throat: Mucous membranes are moist.   Neck: No stridor. Cardiovascular: Normal rate, regular rhythm. No murmurs, rubs, or gallops. Respiratory: Normal respiratory effort without tachypnea nor retractions. Breath sounds are clear and equal bilaterally. No wheezes/rales/rhonchi. Gastrointestinal: Soft and nontender. Normal bowel sounds Musculoskeletal: Nontender with normal range of motion in all extremities. No lower extremity tenderness nor edema. Neurologic:  Normal speech and language. No gross focal neurologic deficits are appreciated.  Skin:  Skin is warm, dry and intact. No rash noted. Psychiatric: Depressed mood and affect ____________________________________________  ED COURSE:  Pertinent labs & imaging  results that were available during my care of the patient were reviewed by me and considered in my medical decision making (see chart for details). Clinical Course  Patient is in mild distress from intractable vomiting. She will receive IV antiemetics and sedatives as well as IV fluids.  Procedures ____________________________________________   LABS (pertinent  positives/negatives)  Labs Reviewed  COMPREHENSIVE METABOLIC PANEL - Abnormal; Notable for the following:       Result Value   Potassium 3.4 (*)    Glucose, Bld 135 (*)    BUN 5 (*)    All other components within normal limits  CBC - Abnormal; Notable for the following:    Hemoglobin 16.6 (*)    HCT 47.2 (*)    All other components within normal limits  URINALYSIS COMPLETEWITH MICROSCOPIC ([facility] ONLY)  ____________________________________________  FINAL ASSESSMENT AND PLAN  Cyclic vomiting syndrome  Plan: Patient with labs as dictated above. Family states she has not improved since her last ER visit and since seeing gastroenterology 2 days ago. She was placed on Protonix but has not done any better. She is currently feeling some better after fluids, anxiolytics and antiemetics. I will discuss with the hospitalist for observation. I will order a HIDA scan for tomorrow.   Emily Filbert, MD   Note: This dictation was prepared with Dragon dictation. Any transcriptional errors that result from this process are unintentional    Emily Filbert, MD 08/05/16 2236

## 2016-08-05 NOTE — ED Triage Notes (Addendum)
Patient presents to the ED with cyclical vomiting x 2 weeks.  Patient vomiting yellow emesis in triage.  Patient was seen in the ED on Saturday and followed up with GI on Monday. Patient reports no vomiting on Tuesday or Wednesday but then today after lunch she began vomiting again.  Patient reports vomiting 7 times since noon.  Patient reports she occasionally smokes marijuana but hasn't in 2 weeks.  Patient is tearful in triage.  Mother reports blood in emesis but this is not seen when patient vomited in triage.  Patient reports multiple episodes of cyclical vomiting for the past two years and states that no one has been able to figure out why.

## 2016-08-05 NOTE — ED Notes (Signed)
Pt and family made aware that patient needed to give urine sample.

## 2016-08-06 ENCOUNTER — Observation Stay: Payer: BC Managed Care – PPO

## 2016-08-06 DIAGNOSIS — G43A1 Cyclical vomiting, intractable: Secondary | ICD-10-CM

## 2016-08-06 LAB — BASIC METABOLIC PANEL
Anion gap: 10 (ref 5–15)
CALCIUM: 9.2 mg/dL (ref 8.9–10.3)
CHLORIDE: 107 mmol/L (ref 101–111)
CO2: 23 mmol/L (ref 22–32)
CREATININE: 0.67 mg/dL (ref 0.44–1.00)
GFR calc non Af Amer: >60 mL/min (ref >60)
GLUCOSE: 105 mg/dL — AB (ref 65–99)
Potassium: 3.4 mmol/L — ABNORMAL LOW (ref 3.5–5.1)
Sodium: 140 mmol/L (ref 135–145)

## 2016-08-06 LAB — CBC
HCT: 43.6 % (ref 35.0–47.0)
Hemoglobin: 15.2 g/dL (ref 12.0–16.0)
MCH: 32 pg (ref 26.0–34.0)
MCHC: 34.9 g/dL (ref 32.0–36.0)
MCV: 91.6 fL (ref 80.0–100.0)
PLATELETS: 284 10*3/uL (ref 150–440)
RBC: 4.76 MIL/uL (ref 3.80–5.20)
RDW: 12 % (ref 11.5–14.5)
WBC: 9.7 10*3/uL (ref 3.6–11.0)

## 2016-08-06 LAB — URINALYSIS COMPLETE WITH MICROSCOPIC (ARMC ONLY)
Bilirubin Urine: NEGATIVE
GLUCOSE, UA: NEGATIVE mg/dL
HGB URINE DIPSTICK: NEGATIVE
LEUKOCYTES UA: NEGATIVE
NITRITE: NEGATIVE
Protein, ur: NEGATIVE mg/dL
SPECIFIC GRAVITY, URINE: 1.01 (ref 1.005–1.030)
WBC, UA: NONE SEEN WBC/hpf (ref 0–5)
pH: 9 — ABNORMAL HIGH (ref 5.0–8.0)

## 2016-08-06 MED ORDER — ONDANSETRON HCL 4 MG PO TABS: 4.0000 mg | ORAL_TABLET | Freq: Four times a day (QID) | ORAL | Status: DC | PRN

## 2016-08-06 MED ORDER — TECHNETIUM TC 99M MEBROFENIN IV KIT
5.4950 | PACK | Freq: Once | INTRAVENOUS | Status: AC | PRN
  Administered 2016-08-06: 5.495 via INTRAVENOUS

## 2016-08-06 MED ORDER — SODIUM CHLORIDE 0.9 % IV SOLN
INTRAVENOUS | Status: AC
  Administered 2016-08-06: 01:00:00 via INTRAVENOUS

## 2016-08-06 MED ORDER — BOOST / RESOURCE BREEZE PO LIQD
1.0000 | Freq: Two times a day (BID) | ORAL | Status: DC
  Administered 2016-08-07 (×2): 1 via ORAL

## 2016-08-06 MED ORDER — ACETAMINOPHEN 650 MG RE SUPP: 650.0000 mg | Freq: Four times a day (QID) | RECTAL | Status: DC | PRN

## 2016-08-06 MED ORDER — ACETAMINOPHEN 325 MG PO TABS
650.0000 mg | ORAL_TABLET | Freq: Four times a day (QID) | ORAL | Status: DC | PRN
Start: 2016-08-06 — End: 2016-08-07

## 2016-08-06 MED ORDER — TOPIRAMATE 25 MG PO TABS
50.0000 mg | ORAL_TABLET | Freq: Every day | ORAL | Status: DC
  Administered 2016-08-06: 50 mg via ORAL
  Filled 2016-08-06: qty 2

## 2016-08-06 MED ORDER — PANTOPRAZOLE SODIUM 40 MG IV SOLR
40.0000 mg | Freq: Two times a day (BID) | INTRAVENOUS | Status: DC
  Administered 2016-08-06 – 2016-08-07 (×4): 40 mg via INTRAVENOUS
  Filled 2016-08-06 (×4): qty 40

## 2016-08-06 MED ORDER — PROMETHAZINE HCL 25 MG/ML IJ SOLN: 12.5000 mg | Freq: Four times a day (QID) | INTRAMUSCULAR | Status: DC | PRN

## 2016-08-06 MED ORDER — ONDANSETRON HCL 4 MG/2ML IJ SOLN
4.0000 mg | Freq: Four times a day (QID) | INTRAMUSCULAR | Status: DC | PRN
  Administered 2016-08-06: 4 mg via INTRAVENOUS
  Filled 2016-08-06 (×2): qty 2

## 2016-08-06 MED ORDER — PANTOPRAZOLE SODIUM 40 MG PO TBEC: 40.0000 mg | DELAYED_RELEASE_TABLET | Freq: Two times a day (BID) | ORAL | Status: DC

## 2016-08-06 MED ORDER — ENOXAPARIN SODIUM 40 MG/0.4ML ~~LOC~~ SOLN
40.0000 mg | SUBCUTANEOUS | Status: DC
  Administered 2016-08-06: 40 mg via SUBCUTANEOUS
  Filled 2016-08-06: qty 0.4

## 2016-08-06 NOTE — Progress Notes (Signed)
Initial Nutrition Assessment  DOCUMENTATION CODES:   Not applicable  INTERVENTION:  -Recommend Boost Breeze po BID, each supplement provides 250 kcal and 9 grams of protein -Pt may benefit from MVI -Discussed additional sources of protein besides red meat with pt and mom along with eating strategies surrounding nausea, vomiting episodes.  Pt and mother verbalized understanding    NUTRITION DIAGNOSIS:   Inadequate oral intake related to altered GI function as evidenced by per patient/family report.    GOAL:   Patient will meet greater than or equal to 90% of their needs    MONITOR:   PO intake, Supplement acceptance, Weight trends  REASON FOR ASSESSMENT:   Malnutrition Screening Tool    ASSESSMENT:     Pt admitted with intractable nausea and vomiting on and off for the past 3 weeks but pt reports has been dealing with this for 2 years.  Often nausea and vomiting has been associated with migraines but recently it has not been.    Pt reports over the past 3 weeks has been taking mainly soft foods (applesauce, mashed potatoes, soup). For the past year has stayed away from red meat as it causes constipation, does not eat seafood but still will eat chicken.   Tolerated jello, italian ice today for lunch, first meal since admission  Medications reviewed: protonix Labs reviewed: K 3.4, glucose 105  Nutrition-Focused physical exam completed. Findings are WDL for fat depletion, muscle depletion, and edema.    Diet Order:  Diet clear liquid Room service appropriate? Yes; Fluid consistency: Thin  Skin:  Reviewed, no issues  Last BM:  PTA  Height:   Ht Readings from Last 1 Encounters:  08/06/16 5\' 8"  (1.727 m)    Weight: Pt reports wt up and down.    Wt Readings from Last 1 Encounters:  08/06/16 213 lb 14.4 oz (97 kg)    Ideal Body Weight:     BMI:  Body mass index is 32.52 kg/m.  Estimated Nutritional Needs:   Kcal:  2100-2500 kcals/d  Protein:  116-145  g/d  Fluid:  >/= 2 L/d  EDUCATION NEEDS:   Education needs addressed  Mersades Barbaro B. Freida BusmanAllen, RD, LDN [phone] (pager) Weekend/On-Call pager [phone])

## 2016-08-06 NOTE — Progress Notes (Signed)
[facility] - [place] at [facility]   PATIENT NAME: Renee Carter    MR#:  [id number]  DATE OF BIRTH:  05/11/1996  SUBJECTIVE:  Came in with intractable vomiting on and off for 3 weeks. No migrain HA Tolerating CLD today  REVIEW OF SYSTEMS:   Review of Systems  Constitutional: Negative for chills, fever and weight loss.  HENT: Negative for ear discharge, ear pain and nosebleeds.   Eyes: Negative for blurred vision, pain and discharge.  Respiratory: Negative for sputum production, shortness of breath, wheezing and stridor.   Cardiovascular: Negative for chest pain, palpitations, orthopnea and PND.  Gastrointestinal: Negative for abdominal pain, diarrhea and nausea.  Genitourinary: Negative for frequency and urgency.  Musculoskeletal: Negative for back pain and joint pain.  Neurological: Positive for weakness. Negative for sensory change, speech change and focal weakness.  Psychiatric/Behavioral: Negative for depression and hallucinations. The patient is not nervous/anxious.    Tolerating Diet:clears Tolerating PT: ambulatory  DRUG ALLERGIES:  No Known Allergies  VITALS:  Blood pressure (!) 107/57, pulse 70, temperature 98.3 F (36.8 C), temperature source Oral, resp. rate 16, height 5\' 8"  (1.727 m), weight 97 kg (213 lb 14.4 oz), last menstrual period 07/31/2016, SpO2 100 %.  PHYSICAL EXAMINATION:   Physical Exam  GENERAL:  20 y.o.-year-old patient lying in the bed with no acute distress.  EYES: Pupils equal, round, reactive to light and accommodation. No scleral icterus. Extraocular muscles intact.  HEENT: Head atraumatic, normocephalic. Oropharynx and nasopharynx clear.  NECK:  Supple, no jugular venous distention. No thyroid enlargement, no tenderness.  LUNGS: Normal breath sounds bilaterally, no wheezing, rales, rhonchi. No use of accessory muscles of respiration.  CARDIOVASCULAR: S1, S2 normal. No murmurs, rubs, or gallops.  ABDOMEN: Soft,  nontender, nondistended. Bowel sounds present. No organomegaly or mass.  EXTREMITIES: No cyanosis, clubbing or edema b/l.    NEUROLOGIC: Cranial nerves II through XII are intact. No focal Motor or sensory deficits b/l.   PSYCHIATRIC:  patient is alert and oriented x 3.  SKIN: No obvious rash, lesion, or ulcer.   LABORATORY PANEL:  CBC  Recent Labs Lab 08/06/16 0504  WBC 9.7  HGB 15.2  HCT 43.6  PLT 284    Chemistries   Recent Labs Lab 08/05/16 1900 08/06/16 0504  NA 138 140  K 3.4* 3.4*  CL 102 107  CO2 24 23  GLUCOSE 135* 105*  BUN 5* <5*  CREATININE 0.83 0.67  CALCIUM 9.9 9.2  AST 21  --   ALT 23  --   ALKPHOS 61  --   BILITOT 1.1  --    Cardiac Enzymes No results for input(s): TROPONINI in the last 168 hours. RADIOLOGY:  Nm Hepato W/eject Fract  Result Date: 08/06/2016 CLINICAL DATA:  Vomiting. EXAM: NUCLEAR MEDICINE HEPATOBILIARY IMAGING WITH GALLBLADDER EF TECHNIQUE: Sequential images of the abdomen were obtained out to 60 minutes following intravenous administration of radiopharmaceutical. After oral ingestion of Ensure, gallbladder ejection fraction was determined. At 60 min, normal ejection fraction is greater than 33%. RADIOPHARMACEUTICALS:  5.5 mCi Tc-6567m  Choletec IV COMPARISON:  Abdominal series 5 02/2016.  Ultrasound 04/01/2016. FINDINGS: Prompt uptake and biliary excretion of activity by the liver is seen. Gallbladder activity is visualized, consistent with patency of cystic duct. Biliary activity passes into small bowel, consistent with patent common bile duct. Calculated gallbladder ejection fraction is 45%. (Normal gallbladder ejection fraction with Ensure is greater than 33%.) IMPRESSION: Normal exam Electronically Signed   By: Maisie Fushomas  Register   On: 08/06/2016 11:42   ASSESSMENT AND PLAN:  Renee Carter  is a 20 y.o. female who presents with Intractable nausea and vomiting. Patient has been seeing gastroenterology for cyclic vomiting for the past several  months. Is a history of migraines, and initially her uncontrollable vomiting episodes would happen in conjunction migraines. However, recently she has been having these vomiting episodes without migraines.  1.Intractable nausea and vomiting - when necessary antiemetics, IV fluids for hydration, HIDA scan negative -Follows with Dr Markham Jordan -recent EGD showed some gastritis -?cyclical vomiting   2.Migraine - continue home prophylactic medication. -recently on Topiramate  3.  Gastritis - given this diagnosis after EGD, placed on PPI, continue the same while here  Will advance diet as tolerated D/w pt and mom  Case discussed with Care Management/Social Worker. Management plans discussed with the patient, family and they are in agreement.  CODE STATUS: FULL  DVT Prophylaxis: ambulatory TOTAL TIME TAKING CARE OF THIS PATIENT: .  >50% time spent on counselling and coordination of care  POSSIBLE D/C IN 1 DAYS, DEPENDING ON CLINICAL CONDITION.  Note: This dictation was prepared with Dragon dictation along with smaller phrase technology. Any transcriptional errors that result from this process are unintentional.  Jalynn Waddell M.D on 08/06/2016 at 1:13 PM  Between 7am to 6pm - Pager - [phone]  After 6pm go to [email or web address] - password [email or web address] [facility]  [facility]  Office  [phone]  CC: Primary care physician; No PCP Per Patient

## 2016-08-06 NOTE — Consult Note (Signed)
Midge Minium, MD [facility]  [address], [address] [place], [place] [address] Phone: [phone] Fax : [phone]  Consultation  Referring Provider:     No ref. provider found Primary Care Physician:  No PCP Per Patient Primary Gastroenterologist:  Dr. Mechele Collin         Reason for Consultation:     Cyclic nausea and vomiting  Date of Admission:  08/05/2016 Date of Consultation:  08/06/2016         HPI:   Renee Carter is a 20 y.o. female who has a history of nausea and vomiting that has in the past been associated with her  Migraine headaches.The patient now reports that her nausea and vomiting, without migraine headaches.  She reports that she has had some weight loss because of the nausea and vomiting.  She is unsure of how much weight she has lost.  The patient is accompanied in the room by her mother at the time of my interview. The patient does admit to recreational marijuana use.  She also was told in the ER that this may be related to anxiety and was given medication for anxiety and she states that the nausea got better.  The atient has had 2 upper endoscopies one in July of last year and one in May of this year. Both endoscopies had some minor abnormalities including some esophagitis and gastritis both of which would not explain this patient's recurrent  Symptoms.  Past Medical History:  Diagnosis Date  . Cyclical vomiting   . Migraine   . Migraine     Past Surgical History:  Procedure Laterality Date  . ANTERIOR CRUCIATE LIGAMENT REPAIR    . ESOPHAGOGASTRODUODENOSCOPY N/A 06/12/2015   Procedure: ESOPHAGOGASTRODUODENOSCOPY (EGD);  Surgeon: Scot Jun, MD;  Location: [facility] ENDOSCOPY;  Service: Endoscopy;  Laterality: N/A;  . KNEE SURGERY      Prior to Admission medications   Medication Sig Start Date End Date Taking? Authorizing Provider  ergocalciferol (VITAMIN D2) 50000 units capsule Take 50,000 Units by mouth once a week.   Yes Historical Provider, MD  metoCLOPramide  (REGLAN) 5 MG tablet Take 5 mg by mouth 4 (four) times daily as needed for nausea. 07/26/16  Yes Historical Provider, MD  omeprazole (PRILOSEC) 40 MG capsule Take 40 mg by mouth 2 (two) times daily.    Yes Historical Provider, MD  ondansetron (ZOFRAN) 4 MG tablet Take 4 mg by mouth every 8 (eight) hours as needed for nausea. 07/24/16  Yes Historical Provider, MD  pantoprazole (PROTONIX) 40 MG tablet Take 40 mg by mouth 2 (two) times daily.   Yes Historical Provider, MD  promethazine (PHENERGAN) 25 MG suppository Place 25 mg rectally every 8 (eight) hours as needed for nausea or vomiting.   Yes Historical Provider, MD  sucralfate (CARAFATE) 1 g tablet Take 1 g by mouth 3 (three) times daily before meals.   Yes Historical Provider, MD  SUMAtriptan (IMITREX) 100 MG tablet Take 100 mg by mouth See admin instructions. Take 1 tablet by mouth as needed for migraine for up to 1 dose, may take 2nd dose after 2 hours if needed. 05/24/16  Yes Historical Provider, MD  topiramate (TOPAMAX) 50 MG tablet Take 50 mg by mouth at bedtime. 07/21/16  Yes Historical Provider, MD  vitamin B-12 (CYANOCOBALAMIN) 1000 MCG tablet Take 1,000 mcg by mouth daily.   Yes Historical Provider, MD  ciprofloxacin (CIPRO) 500 MG tablet Take 1 tablet (500 mg total) by mouth 2 (two) times daily. Patient  not taking: Reported on 07/31/2016 04/03/16   Katha Hamming, MD  ondansetron (ZOFRAN ODT) 4 MG disintegrating tablet Take 1 tablet (4 mg total) by mouth every 8 (eight) hours as needed for nausea or vomiting. Patient not taking: Reported on 07/31/2016 03/26/16   Gayla Doss, MD    Family History  Problem Relation Age of Onset  . Gallbladder disease Mother   . Hypertension Mother   . Migraines Mother      Social History  Substance Use Topics  . Smoking status: Current Some Day Smoker    Types: Cigarettes  . Smokeless tobacco: Never Used  . Alcohol use Yes    Allergies as of 08/05/2016  . (No Known Allergies)    Review of  Systems:    All systems reviewed and negative except where noted in HPI.   Physical Exam:  Vital signs in last 24 hours: Temp:  [98 F (36.7 C)-98.6 F (37 C)] 98.6 F (37 C) (09/08 1431) Pulse Rate:  [70-111] 78 (09/08 1431) Resp:  [16-18] 16 (09/08 1431) BP: (107-156)/(57-108) 151/94 (09/08 1431) SpO2:  [98 %-100 %] 100 % (09/08 1431) Weight:  [182 lb (82.6 kg)-213 lb 14.4 oz (97 kg)] 213 lb 14.4 oz (97 kg) (09/08 0036) Last BM Date:  (regularly everyday; over a week ago) General:   Pleasant, cooperative in NAD Head:  Normocephalic and atraumatic. Eyes:   No icterus.   Conjunctiva pink. PERRLA. Ears:  Normal auditory acuity. Neck:  Supple; no masses or thyroidomegaly Lungs: Respirations even and unlabored. Lungs clear to auscultation bilaterally.   No wheezes, crackles, or rhonchi.  Heart:  Regular rate and rhythm;  Without murmur, clicks, rubs or gallops Abdomen:  Soft, nondistended, nontender. Normal bowel sounds. No appreciable masses or hepatomegaly.  No rebound or guarding.  Rectal:  Not performed. Msk:  Symmetrical without gross deformities.    Extremities:  Without edema, cyanosis or clubbing. Neurologic:  Alert and oriented x3;  grossly normal neurologically. Skin:  Intact without significant lesions or rashes. Cervical Nodes:  No significant cervical adenopathy. Psych:  Alert and cooperative. Normal affect.  LAB RESULTS:  Recent Labs  08/05/16 1900 08/06/16 0504  WBC 9.0 9.7  HGB 16.6* 15.2  HCT 47.2* 43.6  PLT 331 284   BMET  Recent Labs  08/05/16 1900 08/06/16 0504  NA 138 140  K 3.4* 3.4*  CL 102 107  CO2 24 23  GLUCOSE 135* 105*  BUN 5* <5*  CREATININE 0.83 0.67  CALCIUM 9.9 9.2   LFT  Recent Labs  08/05/16 1900  PROT 8.0  ALBUMIN 5.0  AST 21  ALT 23  ALKPHOS 61  BILITOT 1.1   PT/INR No results for input(s): LABPROT, INR in the last 72 hours.  STUDIES: Nm Hepato W/eject Fract  Result Date: 08/06/2016 CLINICAL DATA:  Vomiting.  EXAM: NUCLEAR MEDICINE HEPATOBILIARY IMAGING WITH GALLBLADDER EF TECHNIQUE: Sequential images of the abdomen were obtained out to 60 minutes following intravenous administration of radiopharmaceutical. After oral ingestion of Ensure, gallbladder ejection fraction was determined. At 60 min, normal ejection fraction is greater than 33%. RADIOPHARMACEUTICALS:  5.5 mCi Tc-42m  Choletec IV COMPARISON:  Abdominal series 5 02/2016.  Ultrasound 04/01/2016. FINDINGS: Prompt uptake and biliary excretion of activity by the liver is seen. Gallbladder activity is visualized, consistent with patency of cystic duct. Biliary activity passes into small bowel, consistent with patent common bile duct. Calculated gallbladder ejection fraction is 45%. (Normal gallbladder ejection fraction with Ensure is greater than 33%.)  IMPRESSION: Normal exam Electronically Signed   By: Maisie Fushomas  Register   On: 08/06/2016 11:42      Impression / Plan:   Renee Carter is a 20 y.o. y/o female with cyclic vomiting that may be related to her marijuana use.  I have explained to her that it also may not be related to her marijuana use..   Either way there is no sign of any GI abnormality as the cause of her symptoms.  The patient should be treated for her symptoms and avoid marijuana use.  I do not believe a repeat upper endoscopy will be any benefit with the patient not being helped with a PPI and Carafate in the past it is unlikely related to the patient's stomach acids.  The patient and her mother have been explained the plan and agree with it..  Thank you for involving me in the care of this patient.      LOS: 0 days   Midge Miniumarren Reyes Aldaco, MD  08/06/2016, 5:33 PM   Note: This dictation was prepared with Dragon dictation along with smaller phrase technology. Any transcriptional errors that result from this process are unintentional.

## 2016-08-07 MED ORDER — BOOST / RESOURCE BREEZE PO LIQD: 1.0000 | Freq: Two times a day (BID) | ORAL | 0 refills | Status: AC

## 2016-08-07 NOTE — Discharge Summary (Signed)
[facility]   PATIENT NAME: Renee Carter    MR#:  [id number]  DATE OF BIRTH:  09-17-1996  DATE OF ADMISSION:  08/05/2016 ADMITTING PHYSICIAN: Oralia Manis, MD  DATE OF DISCHARGE: 08/07/16  PRIMARY CARE PHYSICIAN: No PCP Per Patient    ADMISSION DIAGNOSIS:  Cyclic vomiting syndrome [G43.A0] Intractable cyclical vomiting with nausea [G43.A1]  DISCHARGE DIAGNOSIS:  Cyclical vomiting  SECONDARY DIAGNOSIS:   Past Medical History:  Diagnosis Date  . Cyclical vomiting   . Migraine   . Migraine     HOSPITAL COURSE:   MacyWilliamsis a 20 y.o.femalewho presents with Intractable nausea and vomiting. Patient has been seeing gastroenterology for cyclic vomiting for the past several months. Is a history of migraines, and initially her uncontrollable vomiting episodes would happen in conjunction migraines. However, recently she has been having these vomiting episodes without migraines.  1.Intractable nausea and vomiting  Appears cyclical patter (has been having it for 2 years) - when necessary antiemetics,  -received IV fluids for hydration, HIDA scan negative -Follows with Dr Bunnie Philips by Dr Servando Snare -recent EGD showed some gastritis  2.Migraine - continue home prophylactic medication. -recently on Topiramate  3.Gastritis - given this diagnosis after EGD, placed on PPI, continue the same while here  Will advance diet as tolerated D/w pt and mom Ok to go home CONSULTS OBTAINED:  Treatment Team:  Midge Minium, MD  DRUG ALLERGIES:  No Known Allergies  DISCHARGE MEDICATIONS:   Current Discharge Medication List    START taking these medications   Details  feeding supplement (BOOST / RESOURCE BREEZE) LIQD Take 1 Container by mouth 2 (two) times daily between meals. Qty: 30 Container, Refills: 0      CONTINUE these medications which have NOT CHANGED   Details  ergocalciferol (VITAMIN D2) 50000 units capsule Take  50,000 Units by mouth once a week.    metoCLOPramide (REGLAN) 5 MG tablet Take 5 mg by mouth 4 (four) times daily as needed for nausea.    ondansetron (ZOFRAN) 4 MG tablet Take 4 mg by mouth every 8 (eight) hours as needed for nausea.    pantoprazole (PROTONIX) 40 MG tablet Take 40 mg by mouth 2 (two) times daily.    promethazine (PHENERGAN) 25 MG suppository Place 25 mg rectally every 8 (eight) hours as needed for nausea or vomiting.    sucralfate (CARAFATE) 1 g tablet Take 1 g by mouth 3 (three) times daily before meals.    SUMAtriptan (IMITREX) 100 MG tablet Take 100 mg by mouth See admin instructions. Take 1 tablet by mouth as needed for migraine for up to 1 dose, may take 2nd dose after 2 hours if needed.    topiramate (TOPAMAX) 50 MG tablet Take 50 mg by mouth at bedtime.    vitamin B-12 (CYANOCOBALAMIN) 1000 MCG tablet Take 1,000 mcg by mouth daily.    ciprofloxacin (CIPRO) 500 MG tablet Take 1 tablet (500 mg total) by mouth 2 (two) times daily. Qty: 10 tablet, Refills: 0    ondansetron (ZOFRAN ODT) 4 MG disintegrating tablet Take 1 tablet (4 mg total) by mouth every 8 (eight) hours as needed for nausea or vomiting. Qty: 12 tablet, Refills: 0      STOP taking these medications     omeprazole (PRILOSEC) 40 MG capsule         If you experience worsening of your admission symptoms, develop shortness of breath, life threatening emergency, suicidal or homicidal thoughts you must seek  medical attention immediately by calling 911 or calling your MD immediately  if symptoms less severe.  You Must read complete instructions/literature along with all the possible adverse reactions/side effects for all the Medicines you take and that have been prescribed to you. Take any new Medicines after you have completely understood and accept all the possible adverse reactions/side effects.   Please note  You were cared for by a hospitalist during your hospital stay. If you have any questions  about your discharge medications or the care you received while you were in the hospital after you are discharged, you can call the unit and asked to speak with the hospitalist on call if the hospitalist that took care of you is not available. Once you are discharged, your primary care physician will handle any further medical issues. Please note that NO REFILLS for any discharge medications will be authorized once you are discharged, as it is imperative that you return to your primary care physician (or establish a relationship with a primary care physician if you do not have one) for your aftercare needs so that they can reassess your need for medications and monitor your lab values. Today   SUBJECTIVE   Emesis x1 yesterday  VITAL SIGNS:  Blood pressure (!) 95/45, pulse (!) 55, temperature 97.6 F (36.4 C), temperature source Oral, resp. rate 16, height 5\' 8"  (1.727 m), weight 97 kg (213 lb 14.4 oz), last menstrual period 07/31/2016, SpO2 100 %.  I/O:   Intake/Output Summary (Last 24 hours) at 08/07/16 0949 Last data filed at 08/07/16 0522  Gross per 24 hour  Intake                0 ml  Output              600 ml  Net             -600 ml    PHYSICAL EXAMINATION:  GENERAL:  20 y.o.-year-old patient lying in the bed with no acute distress.  EYES: Pupils equal, round, reactive to light and accommodation. No scleral icterus. Extraocular muscles intact.  HEENT: Head atraumatic, normocephalic. Oropharynx and nasopharynx clear.  NECK:  Supple, no jugular venous distention. No thyroid enlargement, no tenderness.  LUNGS: Normal breath sounds bilaterally, no wheezing, rales,rhonchi or crepitation. No use of accessory muscles of respiration.  CARDIOVASCULAR: S1, S2 normal. No murmurs, rubs, or gallops.  ABDOMEN: Soft, non-tender, non-distended. Bowel sounds present. No organomegaly or mass.  EXTREMITIES: No pedal edema, cyanosis, or clubbing.  NEUROLOGIC: Cranial nerves II through XII are  intact. Muscle strength 5/5 in all extremities. Sensation intact. Gait not checked.  PSYCHIATRIC: The patient is alert and oriented x 3.  SKIN: No obvious rash, lesion, or ulcer.   DATA REVIEW:   CBC   Recent Labs Lab 08/06/16 0504  WBC 9.7  HGB 15.2  HCT 43.6  PLT 284    Chemistries   Recent Labs Lab 08/05/16 1900 08/06/16 0504  NA 138 140  K 3.4* 3.4*  CL 102 107  CO2 24 23  GLUCOSE 135* 105*  BUN 5* <5*  CREATININE 0.83 0.67  CALCIUM 9.9 9.2  AST 21  --   ALT 23  --   ALKPHOS 61  --   BILITOT 1.1  --     Microbiology Results   No results found for this or any previous visit (from the past 240 hour(s)).  RADIOLOGY:  Nm Hepato W/eject Fract  Result Date: 08/06/2016 CLINICAL DATA:  Vomiting. EXAM: NUCLEAR MEDICINE HEPATOBILIARY IMAGING WITH GALLBLADDER EF TECHNIQUE: Sequential images of the abdomen were obtained out to 60 minutes following intravenous administration of radiopharmaceutical. After oral ingestion of Ensure, gallbladder ejection fraction was determined. At 60 min, normal ejection fraction is greater than 33%. RADIOPHARMACEUTICALS:  5.5 mCi Tc-5232m  Choletec IV COMPARISON:  Abdominal series 5 02/2016.  Ultrasound 04/01/2016. FINDINGS: Prompt uptake and biliary excretion of activity by the liver is seen. Gallbladder activity is visualized, consistent with patency of cystic duct. Biliary activity passes into small bowel, consistent with patent common bile duct. Calculated gallbladder ejection fraction is 45%. (Normal gallbladder ejection fraction with Ensure is greater than 33%.) IMPRESSION: Normal exam Electronically Signed   By: Maisie Fushomas  Register   On: 08/06/2016 11:42     Management plans discussed with the patient, family and they are in agreement.  CODE STATUS:     Code Status Orders        Start     Ordered   08/06/16 0029  Full code  Continuous     08/06/16 0028    Code Status History    Date Active Date Inactive Code Status Order ID  Comments User Context   04/01/2016  6:46 PM 04/03/2016  8:17 PM Full Code [id number]  Adrian SaranSital Mody, MD Inpatient      TOTAL TIME TAKING CARE OF THIS PATIENT: 40 minutes.    Wednesday Ericsson M.D on 08/07/2016 at 9:49 AM  Between 7am to 6pm - Pager - [phone] After 6pm go to [email or web address] - password [email or web address] [facility]  [facility]  Office  [phone]  CC: Primary care physician; No PCP Per Patient

## 2016-08-07 NOTE — Progress Notes (Signed)
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  Call Enedina FinnerSona Melena Hayes MD, [facility]  [phone] with questions.  Brae Schaafsma M.D on 08/07/2016,at 12:28 PM

## 2016-08-07 NOTE — Discharge Instructions (Signed)
cont full liquid diet and advance to soft as tolerated

## 2016-08-07 NOTE — Progress Notes (Signed)
Pt nurse notified of low b/p

## 2016-08-12 ENCOUNTER — Ambulatory Visit: Payer: BC Managed Care – PPO

## 2016-08-12 ENCOUNTER — Encounter: Admission: RE | Admit: 2016-08-12 | Payer: BC Managed Care – PPO | Source: Ambulatory Visit

## 2016-09-13 ENCOUNTER — Encounter: Payer: Self-pay | Admitting: Unknown Physician Specialty

## 2018-05-09 IMAGING — NM NM HEPATO W/GB/PHARM/[PERSON_NAME]
4 series · 19 of 19 positions shown · non-contrast
Comparison: Abdominal series [DATE].  Ultrasound 04/01/2016.

CLINICAL DATA: Vomiting.

EXAM:
NUCLEAR MEDICINE HEPATOBILIARY IMAGING WITH GALLBLADDER EF
TECHNIQUE: Sequential images of the abdomen were obtained [DATE] minutes
following intravenous administration of radiopharmaceutical. After
oral ingestion of Ensure, gallbladder ejection fraction was
determined. At 60 min, normal ejection fraction is greater than 33%.
RADIOPHARMACEUTICALS:  5.5 mCi Cc-11m  Choletec IV

[Series 1000: gallbladder ef · 4.80mm/px · 6 of 120 frames shown]
[frame 11/120]
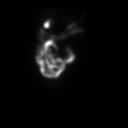
[frame 31/120]
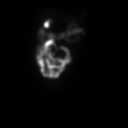
[frame 51/120]
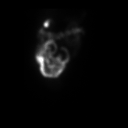
[frame 71/120]
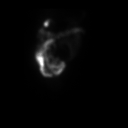
[frame 91/120]
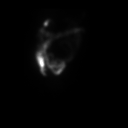
[frame 111/120]
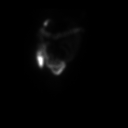

[Series 1000: hepatobiliary scan · 9.59mm/px · 6 of 60 frames shown]
[frame 6/60]
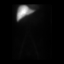
[frame 16/60]
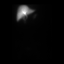
[frame 26/60]
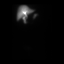
[frame 36/60]
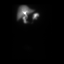
[frame 46/60]
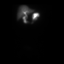
[frame 56/60]
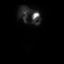

[Series 1000: hepatobiliary scan hr 2 · 9.59mm/px · 6 of 60 frames shown]
[frame 6/60]
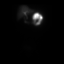
[frame 16/60]
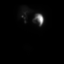
[frame 26/60]
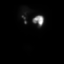
[frame 36/60]
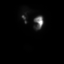
[frame 46/60]
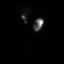
[frame 56/60]
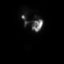

[Series 1000: gallbladder delays · 3.30mm/px · 1 of 1 slices shown]
[im 1/1]
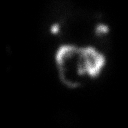

[19 of 19 positions shown; findings below may reference images not displayed]

FINDINGS: Prompt uptake and biliary excretion of activity by the liver is
seen. Gallbladder activity is visualized, consistent with patency of
cystic duct. Biliary activity passes into small bowel, consistent
with patent common bile duct.

Calculated gallbladder ejection fraction is 45%. (Normal gallbladder
ejection fraction with Ensure is greater than 33%.)
IMPRESSION: Normal exam

## 2024-03-27 ENCOUNTER — Ambulatory Visit
Admission: EM | Admit: 2024-03-27 | Discharge: 2024-03-27 | Disposition: A | Discharge status: Home / Self Care | Payer: Self-pay | Attending: Emergency Medicine | Admitting: Emergency Medicine

## 2024-03-27 ENCOUNTER — Encounter: Payer: Self-pay | Admitting: Emergency Medicine

## 2024-03-27 DIAGNOSIS — Z20822 Contact with and (suspected) exposure to covid-19: Secondary | ICD-10-CM | POA: Insufficient documentation

## 2024-03-27 DIAGNOSIS — J029 Acute pharyngitis, unspecified: Secondary | ICD-10-CM | POA: Insufficient documentation

## 2024-03-27 LAB — SARS CORONAVIRUS 2 BY RT PCR: SARS Coronavirus 2 by RT PCR: NEGATIVE

## 2024-03-27 LAB — GROUP A STREP BY PCR: Group A Strep by PCR: NOT DETECTED

## 2024-03-27 MED ORDER — PENICILLIN V POTASSIUM 500 MG PO TABS: 500.0000 mg | ORAL_TABLET | Freq: Two times a day (BID) | ORAL | 0 refills | Status: AC

## 2024-03-27 MED ORDER — FLUTICASONE PROPIONATE 50 MCG/ACT NA SUSP: 2.0000 | Freq: Every day | NASAL | 0 refills | Status: AC

## 2024-03-27 MED ORDER — IBUPROFEN 600 MG PO TABS: 600.0000 mg | ORAL_TABLET | Freq: Three times a day (TID) | ORAL | 0 refills | Status: AC | PRN

## 2024-03-27 NOTE — ED Triage Notes (Signed)
 Pt presents with a sore throat x 2 days. As taken OTC cold and pain medication for her symptoms.

## 2024-03-27 NOTE — Discharge Instructions (Signed)
 Your strep and COVID PCR testing are still pending.  I will prescribe 10 days of penicillin if your strep is positive.  Supportive treatment if COVID is positive.  You will need to mask for 10 days after symptom onset if COVID is positive.  1 gram of Tylenol  and 600 mg ibuprofen together 3-4 times a day as needed for pain.  Make sure you drink plenty of extra fluids.  Some people find salt water gargles and  Traditional Medicinal's "Throat Coat" tea helpful. Take 5 mL of liquid Benadryl  and 5 mL of Maalox/Mylanta. Mix it together, and then hold it in your mouth for as long as you can and then swallow. You may do this 4 times a day.  Honey and lemon dissolved in hot water can also be soothing.  Flonase, saline nasal irrigation with a NeilMed sinus rinse and distilled water as often as you want, Mucinex D for nasal congestion, postnasal drip.  Here is a list of primary care providers who are taking new patients:  [facility] [place] Dr. Rebekah Canada (sports medicine) Dr. Saralee Cummins Cody Das, [place] Dr. Henriette Lofty [address] [place] [address] [phone]  [facility] [address] [place], [place] [address] [phone]  [facility] [place] [address] [place] [address]  [phone]  [facility] [address] [place], [place] [address] [phone]  [facility] [address] [place]  [phone] [place], [place] [address]  Here are clinics/ other resources who will see you if you do not have insurance. Some have certain criteria that you must meet. Call them and find out what they are:  [facility]: [address], [place], [place] [address] Phone: [phone] Hours: First and Third Saturdays of each Month, 9 a.m. - 1 p.m.  [facility]: [address] [place], [place] [address] Phone: [phone] Hours: Tuesday, 4 p.m. - 8 p.m. Thursday, 1 p.m. - 8 p.m. Wednesday, 9 a.m. -  [facility] [address], [place], [place] [address] Phone: [phone] Pharmacy Phone Number: [phone] Dental Phone Number: [phone] [facility] Insurance Help: [phone]  Dental Hours: Monday - Thursday, 8 a.m. - 6 p.m.  [facility] [address], [place], [place] [address] Phone: [phone] Pharmacy Phone Number: [phone] [facility] Insurance Help: [phone]  [facility] [address] [place], [place] [address] Phone: [phone] Pharmacy Phone Number: [phone] [facility] Insurance Help: [phone]  [facility] [address] [place], [place] [address] Phone: [phone] [facility] Insurance Help: [phone]   [facility] [address] [place], [place] [address] Phone: [phone]  Go to www.goodrx.com  or www.costplusdrugs.com to look up your medications. This will give you a list of where you can find your prescriptions at the most affordable prices. Or ask the pharmacist what the cash price is, or if they have any other discount programs available to help make your medication more affordable. This can be less expensive than what you would pay with insurance.

## 2024-03-27 NOTE — ED Provider Notes (Signed)
 HPI  SUBJECTIVE:  Patient reports sore throat starting 2 days ago. Sx worse with swallowing.  Sx better with nothing. Has been taking Tylenol  Cold and flu with severe sinus and ibuprofen 800 mg p.o. twice daily w/ o relief in her sore throat.  It has helped her other symptoms. No measured fevers above 100.4 + cervical lymphadenopathy  no neck stiffness  + Occasional cough from postnasal drip No nasal congestion, rhinorrhea, postnasal drip No Myalgias + Headache No Rash  No abdominal pain     No Recent Strep, COVID exposure No reflux sxs Questionable allergy sxs-thought this was allergies at first  No Breathing difficulty, voice changes, sensation of throat swelling shut No Drooling No Trismus No abx in past month.  No antipyretic in past 6 hrs  PMH migraines LMP 4/18 denies possibility of being pregnant PCP: None.   Past Medical History:  Diagnosis Date   Cyclical vomiting    Migraine    Migraine     Past Surgical History:  Procedure Laterality Date   ANTERIOR CRUCIATE LIGAMENT REPAIR     ESOPHAGOGASTRODUODENOSCOPY N/A 06/12/2015   Procedure: ESOPHAGOGASTRODUODENOSCOPY (EGD);  Surgeon: Cassie Click, MD;  Location: [facility] ENDOSCOPY;  Service: Endoscopy;  Laterality: N/A;   ESOPHAGOGASTRODUODENOSCOPY N/A 04/03/2016   Procedure: ESOPHAGOGASTRODUODENOSCOPY (EGD);  Surgeon: Cassie Click, MD;  Location: [facility] ENDOSCOPY;  Service: Endoscopy;  Laterality: N/A;   KNEE SURGERY      Family History  Problem Relation Age of Onset   Gallbladder disease Mother    Hypertension Mother    Migraines Mother     Social History   Tobacco Use   Smoking status: Some Days    Types: Cigarettes   Smokeless tobacco: Never  Vaping Use   Vaping status: Every Day  Substance Use Topics   Alcohol use: Yes    Comment: social   Drug use: Not Currently    Types: Marijuana    No current facility-administered medications for this encounter.  Current Outpatient Medications:     ergocalciferol (VITAMIN D2) 50000 units capsule, Take 50,000 Units by mouth once a week., Disp: , Rfl:    fluticasone (FLONASE) 50 MCG/ACT nasal spray, Place 2 sprays into both nostrils daily., Disp: 16 g, Rfl: 0   ibuprofen (ADVIL) 600 MG tablet, Take 1 tablet (600 mg total) by mouth every 8 (eight) hours as needed., Disp: 30 tablet, Rfl: 0   feeding supplement (BOOST / RESOURCE BREEZE) LIQD, Take 1 Container by mouth 2 (two) times daily between meals., Disp: 30 Container, Rfl: 0   metoCLOPramide  (REGLAN ) 5 MG tablet, Take 5 mg by mouth 4 (four) times daily as needed for nausea., Disp: , Rfl:    ondansetron  (ZOFRAN  ODT) 4 MG disintegrating tablet, Take 1 tablet (4 mg total) by mouth every 8 (eight) hours as needed for nausea or vomiting. (Patient not taking: Reported on 07/31/2016), Disp: 12 tablet, Rfl: 0   ondansetron  (ZOFRAN ) 4 MG tablet, Take 4 mg by mouth every 8 (eight) hours as needed for nausea., Disp: , Rfl:    pantoprazole  (PROTONIX ) 40 MG tablet, Take 40 mg by mouth 2 (two) times daily., Disp: , Rfl:    promethazine  (PHENERGAN ) 25 MG suppository, Place 25 mg rectally every 8 (eight) hours as needed for nausea or vomiting., Disp: , Rfl:    sucralfate (CARAFATE) 1 g tablet, Take 1 g by mouth 3 (three) times daily before meals., Disp: , Rfl:    SUMAtriptan (IMITREX) 100 MG tablet, Take 100 mg by  mouth See admin instructions. Take 1 tablet by mouth as needed for migraine for up to 1 dose, may take 2nd dose after 2 hours if needed., Disp: , Rfl:    topiramate  (TOPAMAX ) 50 MG tablet, Take 50 mg by mouth at bedtime., Disp: , Rfl:    vitamin B-12 (CYANOCOBALAMIN) 1000 MCG tablet, Take 1,000 mcg by mouth daily., Disp: , Rfl:   No Known Allergies   ROS  As noted in HPI.   Physical Exam  BP 132/85 (BP Location: Left Arm)   Pulse 77   Temp 98.5 F (36.9 C) (Oral)   Resp 16   LMP 03/16/2024   SpO2 99%   Constitutional: Well developed, well nourished, no acute distress Eyes:  EOMI,  conjunctiva normal bilaterally HENT: Normocephalic, atraumatic,mucus membranes moist.  Positive nasal congestion.  Normal turbinates.  Erythematous, swollen tonsils with exudates.  Uvula midline. Respiratory: Normal inspiratory effort Cardiovascular: Normal rate, no murmurs, rubs, gallops GI: nondistended, nontender. No appreciable splenomegaly skin: No rash, skin intact Lymph:+ Anterior cervical LN.  No posterior cervical lymphadenopathy Musculoskeletal: no deformities Neurologic: Alert & oriented x 3, no focal neuro deficits Psychiatric: Speech and behavior appropriate.   ED Course   Medications - No data to display  Orders Placed This Encounter  Procedures   Group A Strep by PCR    Standing Status:   Standing    Number of Occurrences:   1   SARS Coronavirus 2 by RT PCR (hospital order, performed in [facility] hospital lab) *cepheid single result test* Anterior Nasal Swab    Standing Status:   Standing    Number of Occurrences:   1   Airborne and Contact precautions    Standing Status:   Standing    Number of Occurrences:   1    Results for orders placed or performed during the hospital encounter of 03/27/24 (from the past 24 hours)  Group A Strep by PCR     Status: None   Collection Time: 03/27/24  8:27 AM   Specimen: Throat; Sterile Swab  Result Value Ref Range   Group A Strep by PCR NOT DETECTED NOT DETECTED  SARS Coronavirus 2 by RT PCR (hospital order, performed in [facility] hospital lab) *cepheid single result test* Anterior Nasal Swab     Status: None   Collection Time: 03/27/24  8:42 AM   Specimen: Anterior Nasal Swab  Result Value Ref Range   SARS Coronavirus 2 by RT PCR NEGATIVE NEGATIVE   No results found.  ED Clinical Impression  1. Exudative pharyngitis   2. Encounter for laboratory testing for COVID-19 virus      ED Assessment/Plan  {The patient has not been seen in Urgent Care in the last 3 years. :1}  Checking COVID, strep.  Penicillin for  10 days if strep is positive, supportive treatment if COVID is positive.  Strep, COVID PCR negative. Home with ibuprofen, Tylenol , Benadryl /Maalox mixture Flonase, saline nasal rogation, Mucinex D, work note.  However, because of the extensive exudates on her tonsils, will send home with penicillin.  Mono in the differential, but deferring testing since high rate of false negatives early on in illness.  Patient to followup with PCP when necessary, will provide primary care list for routine care.  Discussed labs,  MDM, plan and followup with patient. Discussed sn/sx that should prompt return to the ED. patient agrees with plan.   Meds ordered this encounter  Medications   fluticasone (FLONASE) 50 MCG/ACT nasal spray  Sig: Place 2 sprays into both nostrils daily.    Dispense:  16 g    Refill:  0   ibuprofen (ADVIL) 600 MG tablet    Sig: Take 1 tablet (600 mg total) by mouth every 8 (eight) hours as needed.    Dispense:  30 tablet    Refill:  0     *This clinic note was created using Scientist, clinical (histocompatibility and immunogenetics). Therefore, there may be occasional mistakes despite careful proofreading.

## 2024-12-06 ENCOUNTER — Ambulatory Visit
Admission: EM | Admit: 2024-12-06 | Discharge: 2024-12-06 | Disposition: A | Discharge status: Home / Self Care | Payer: Self-pay | Attending: Family Medicine | Admitting: Family Medicine

## 2024-12-06 DIAGNOSIS — J111 Influenza due to unidentified influenza virus with other respiratory manifestations: Secondary | ICD-10-CM

## 2024-12-06 MED ORDER — OSELTAMIVIR PHOSPHATE 75 MG PO CAPS: 75.0000 mg | ORAL_CAPSULE | Freq: Two times a day (BID) | ORAL | 0 refills | Status: AC

## 2024-12-06 NOTE — ED Triage Notes (Signed)
 Fever, productive Cough, headache, cough, body aches, vomiting 2 days. Had a fever of 101.7 yesterday. Also having chills and hot flashes. States a coworker was sick Monday with the same symptoms.   States alternating tylenol  and ibuprofen  with moderate relief of fever.

## 2024-12-06 NOTE — ED Provider Notes (Signed)
 " [facility]    CSN: [id number] Arrival date & time: 12/06/24  1014      History   Chief Complaint Chief Complaint  Patient presents with   Cough    HPI ELESE RANE is a 29 y.o. female  presents for evaluation of URI symptoms for 2 days. Patient reports associated symptoms of cough, congestion, fever of 101.7, sore throat, body aches, shortness of breath with coughing. Denies N/V/D, ear pain. Patient does not have a hx of asthma. Patient is not an active smoker.   Reports coworker had similar symptoms.  Pt has taken Tylenol  and ibuprofen  and cough medicine OTC for symptoms. Pt has no other concerns at this time.    Cough Associated symptoms: fever, myalgias and sore throat     Past Medical History:  Diagnosis Date   Cyclical vomiting    Migraine    Migraine     Patient Active Problem List   Diagnosis Date Noted   Intractable nausea and vomiting 08/05/2016   Gastritis 08/05/2016   Nausea 04/01/2016   Migraine     Past Surgical History:  Procedure Laterality Date   ANTERIOR CRUCIATE LIGAMENT REPAIR     ESOPHAGOGASTRODUODENOSCOPY N/A 06/12/2015   Procedure: ESOPHAGOGASTRODUODENOSCOPY (EGD);  Surgeon: Lamar ONEIDA Holmes, MD;  Location: [facility] ENDOSCOPY;  Service: Endoscopy;  Laterality: N/A;   ESOPHAGOGASTRODUODENOSCOPY N/A 04/03/2016   Procedure: ESOPHAGOGASTRODUODENOSCOPY (EGD);  Surgeon: Lamar ONEIDA Holmes, MD;  Location: [facility] ENDOSCOPY;  Service: Endoscopy;  Laterality: N/A;   KNEE SURGERY      OB History   No obstetric history on file.      Home Medications    Prior to Admission medications  Medication Sig Start Date End Date Taking? Authorizing Provider  oseltamivir  (TAMIFLU ) 75 MG capsule Take 1 capsule (75 mg total) by mouth every 12 (twelve) hours. 12/06/24  Yes Luismario Coston, Jodi R, NP  ergocalciferol (VITAMIN D2) 50000 units capsule Take 50,000 Units by mouth once a week.    [provider]  feeding supplement (BOOST / RESOURCE BREEZE) LIQD  Take 1 Container by mouth 2 (two) times daily between meals. 08/07/16   Patel, Sona, MD  fluticasone  (FLONASE ) 50 MCG/ACT nasal spray Place 2 sprays into both nostrils daily. 03/27/24   Van Knee, MD  ibuprofen  (ADVIL ) 600 MG tablet Take 1 tablet (600 mg total) by mouth every 8 (eight) hours as needed. 03/27/24   Van Knee, MD  metoCLOPramide  (REGLAN ) 5 MG tablet Take 5 mg by mouth 4 (four) times daily as needed for nausea. 07/26/16   [provider]  ondansetron  (ZOFRAN  ODT) 4 MG disintegrating tablet Take 1 tablet (4 mg total) by mouth every 8 (eight) hours as needed for nausea or vomiting. Patient not taking: Reported on 07/31/2016 03/26/16   Sharman Sena DELENA, MD  ondansetron  (ZOFRAN ) 4 MG tablet Take 4 mg by mouth every 8 (eight) hours as needed for nausea. 07/24/16   [provider]  pantoprazole  (PROTONIX ) 40 MG tablet Take 40 mg by mouth 2 (two) times daily.    [provider]  promethazine  (PHENERGAN ) 25 MG suppository Place 25 mg rectally every 8 (eight) hours as needed for nausea or vomiting.    [provider]  sucralfate (CARAFATE) 1 g tablet Take 1 g by mouth 3 (three) times daily before meals.    [provider]  SUMAtriptan (IMITREX) 100 MG tablet Take 100 mg by mouth See admin instructions. Take 1 tablet by mouth as needed for migraine for  up to 1 dose, may take 2nd dose after 2 hours if needed. 05/24/16   [provider]  topiramate  (TOPAMAX ) 50 MG tablet Take 50 mg by mouth at bedtime. 07/21/16   [provider]  vitamin B-12 (CYANOCOBALAMIN) 1000 MCG tablet Take 1,000 mcg by mouth daily.    [provider]    Family History Family History  Problem Relation Age of Onset   Gallbladder disease Mother    Hypertension Mother    Migraines Mother     Social History Social History[1]   Allergies   Patient has no known allergies.   Review of Systems Review of Systems  Constitutional:  Positive for  fever.  HENT:  Positive for congestion and sore throat.   Respiratory:  Positive for cough.   Musculoskeletal:  Positive for myalgias.     Physical Exam Triage Vital Signs ED Triage Vitals  Encounter Vitals Group     BP 12/06/24 1036 120/85     Girls Systolic BP Percentile --      Girls Diastolic BP Percentile --      Boys Systolic BP Percentile --      Boys Diastolic BP Percentile --      Pulse Rate 12/06/24 1036 89     Resp 12/06/24 1036 18     Temp 12/06/24 1036 98.2 F (36.8 C)     Temp Source 12/06/24 1036 Oral     SpO2 12/06/24 1036 98 %     Weight --      Height 12/06/24 1035 5' 8 (1.727 m)     Head Circumference --      Peak Flow --      Pain Score 12/06/24 1034 7     Pain Loc --      Pain Education --      Exclude from Growth Chart --    No data found.  Updated Vital Signs BP 120/85 (BP Location: Left Arm)   Pulse 89   Temp 98.2 F (36.8 C) (Oral) Comment: Last had Ibuprofen  at 0930  Resp 18   Ht 5' 8 (1.727 m)   LMP 12/05/2024 (Exact Date)   SpO2 98%   BMI 32.52 kg/m   Visual Acuity Right Eye Distance:   Left Eye Distance:   Bilateral Distance:    Right Eye Near:   Left Eye Near:    Bilateral Near:     Physical Exam Vitals and nursing note reviewed.  Constitutional:      General: She is not in acute distress.    Appearance: She is well-developed. She is not ill-appearing.  HENT:     Head: Normocephalic and atraumatic.     Right Ear: Tympanic membrane and ear canal normal.     Left Ear: Tympanic membrane and ear canal normal.     Nose: Congestion present.     Mouth/Throat:     Mouth: Mucous membranes are moist.     Pharynx: Oropharynx is clear. Uvula midline. Posterior oropharyngeal erythema present.     Tonsils: No tonsillar exudate or tonsillar abscesses.  Eyes:     Conjunctiva/sclera: Conjunctivae normal.     Pupils: Pupils are equal, round, and reactive to light.  Cardiovascular:     Rate and Rhythm: Normal rate and regular  rhythm.     Heart sounds: Normal heart sounds.  Pulmonary:     Effort: Pulmonary effort is normal.     Breath sounds: Normal breath sounds. No wheezing, rhonchi or rales.  Musculoskeletal:  Cervical back: Normal range of motion and neck supple.  Lymphadenopathy:     Cervical: No cervical adenopathy.  Skin:    General: Skin is warm and dry.  Neurological:     General: No focal deficit present.     Mental Status: She is alert and oriented to person, place, and time.  Psychiatric:        Mood and Affect: Mood normal.        Behavior: Behavior normal.      UC Treatments / Results  Labs (all labs ordered are listed, but only abnormal results are displayed) Labs Reviewed - No data to display  EKG   Radiology No results found.  Procedures Procedures (including critical care time)  Medications Ordered in UC Medications - No data to display  Initial Impression / Assessment and Plan / UC Course  I have reviewed the triage vital signs and the nursing notes.  Pertinent labs & imaging results that were available during my care of the patient were reviewed by me and considered in my medical decision making (see chart for details).     Reviewed exam and symptoms with patient.  No red flags.  Exam and symptoms consistent with influenza.  Given testing shortage will defer testing and treat with Tamiflu .  Patient can continue over-the-counter cough medicine, Tylenol  or ibuprofen  as needed.  Encourage rest fluids and PCP follow-up as symptoms do not improve.  ER precautions reviewed. Final Clinical Impressions(s) / UC Diagnoses   Final diagnoses:  Influenza-like illness     Discharge Instructions      Your symptoms and exam are consistent with influenza.  Start Tamiflu  antiviral twice daily for 5 days.  This helps to reduce severity symptoms and chance of complication.  It does not make the flu go away.  Continue over-the-counter cough medicine, rest fluids as needed.   Follow-up with your PCP if your symptoms do not improve.  Please go to the ER for any worsening symptoms.  Hope you feel better soon!    ED Prescriptions     Medication Sig Dispense Auth. Provider   oseltamivir  (TAMIFLU ) 75 MG capsule Take 1 capsule (75 mg total) by mouth every 12 (twelve) hours. 10 capsule Oshua Mcconaha, Jodi R, NP      PDMP not reviewed this encounter.    [1]  Social History Tobacco Use   Smoking status: Former    Types: Cigarettes   Smokeless tobacco: Never  Vaping Use   Vaping status: Every Day  Substance Use Topics   Alcohol use: Yes    Comment: social   Drug use: Not Currently    Types: Marijuana     Loreda Myla SAUNDERS, NP 12/06/24 1117  "

## 2024-12-06 NOTE — Discharge Instructions (Signed)
 Your symptoms and exam are consistent with influenza.  Start Tamiflu  antiviral twice daily for 5 days.  This helps to reduce severity symptoms and chance of complication.  It does not make the flu go away.  Continue over-the-counter cough medicine, rest fluids as needed.  Follow-up with your PCP if your symptoms do not improve.  Please go to the ER for any worsening symptoms.  Hope you feel better soon!
